# Patient Record
Sex: Female | Born: 1975 | Race: Black or African American | Hispanic: No | Smoking: Never smoker
Health system: Southern US, Community
[De-identification: ages and names within clinical notes are randomized; demographics above are authoritative.]

## PROBLEM LIST (undated history)

## (undated) DIAGNOSIS — R7303 Prediabetes: Secondary | ICD-10-CM

## (undated) DIAGNOSIS — M7730 Calcaneal spur, unspecified foot: Secondary | ICD-10-CM

## (undated) DIAGNOSIS — I10 Essential (primary) hypertension: Secondary | ICD-10-CM

## (undated) DIAGNOSIS — F32A Depression, unspecified: Secondary | ICD-10-CM

## (undated) HISTORY — PX: GANGLION CYST EXCISION: SHX1691

## (undated) HISTORY — DX: Depression, unspecified: F32.A

---

## 2006-11-16 ENCOUNTER — Emergency Department: Payer: Self-pay | Admitting: Unknown Physician Specialty

## 2007-01-02 ENCOUNTER — Emergency Department: Payer: Self-pay | Admitting: Emergency Medicine

## 2007-11-14 ENCOUNTER — Emergency Department: Payer: Self-pay | Admitting: Emergency Medicine

## 2007-11-15 ENCOUNTER — Emergency Department: Payer: Self-pay | Admitting: Emergency Medicine

## 2008-06-26 ENCOUNTER — Ambulatory Visit: Payer: Self-pay | Admitting: Gastroenterology

## 2009-11-16 ENCOUNTER — Emergency Department: Payer: Self-pay | Admitting: Emergency Medicine

## 2011-05-25 ENCOUNTER — Emergency Department: Payer: Self-pay | Admitting: Internal Medicine

## 2011-12-28 ENCOUNTER — Emergency Department: Payer: Self-pay | Admitting: Emergency Medicine

## 2012-01-25 ENCOUNTER — Ambulatory Visit: Payer: Self-pay | Admitting: Otolaryngology

## 2014-06-01 ENCOUNTER — Emergency Department: Payer: Self-pay | Admitting: Emergency Medicine

## 2014-12-02 ENCOUNTER — Emergency Department: Payer: Self-pay | Admitting: Emergency Medicine

## 2015-01-25 NOTE — Op Note (Signed)
PATIENT NAME:  Morgan Murphy, Morgan Murphy MR#:  [id number] DATE OF BIRTH:  1976-01-27  DATE OF PROCEDURE:  01/25/2012  PREOPERATIVE DIAGNOSIS: Tonsillar hyperplasia.   POSTOPERATIVE DIAGNOSIS: Tonsillar hyperplasia.   PROCEDURE PERFORMED: Tonsillectomy.   SURGEON: Malon Kindle, MD   ANESTHESIA: General endotracheal.   INDICATIONS: This is a 39 year old female with significant tonsillar hyperplasia with upper airway obstructive symptoms with loud snoring and possible sleep apnea.   FINDINGS: Tonsils were 3+ in size. The nasopharynx was inspected and there was no significant adenoid hyperplasia.   COMPLICATIONS: None.   DESCRIPTION OF PROCEDURE: After obtaining informed consent, the patient was taken to the operating room and placed in the supine position. After induction of general endotracheal anesthesia, the patient was turned 90 degrees and the head draped with the eyes protected. A McIvor retractor was used to open the mouth. The palate was palpated and there was no evidence of submucous cleft. The right tonsil was grasped with an Allis and resected from the tonsillar fossa in the usual fashion with the Bovie. The left tonsil was resected in a similar fashion. Cautery was used to control minor bleeding. Each tonsillar fossa was injected with 0.25% Marcaine with epinephrine 1:200,000. The nasopharynx was then inspected. There was no evidence of any significant adenoid hyperplasia. The throat was irrigated and suctioned to       remove any blood clot. She was returned to the anesthesiologist for awakening. She was awakened and taken to the recovery room in good condition postoperatively. Blood loss was less than 25 mL. ____________________________ Sammuel Hines. Richardson Landry, MD psb:slb D: 01/25/2012 07:59:59 ET T: 01/25/2012 09:30:00 ET JOB#: [id number]  cc: Sammuel Hines. Richardson Landry, MD, <Dictator> Riley Nearing MD ELECTRONICALLY SIGNED 02/07/2012 8:44

## 2015-07-14 ENCOUNTER — Other Ambulatory Visit: Payer: Self-pay | Admitting: Family Medicine

## 2015-07-14 DIAGNOSIS — R102 Pelvic and perineal pain: Principal | ICD-10-CM

## 2015-07-14 DIAGNOSIS — G8929 Other chronic pain: Secondary | ICD-10-CM

## 2015-07-15 ENCOUNTER — Ambulatory Visit
Admission: RE | Admit: 2015-07-15 | Discharge: 2015-07-15 | Disposition: A | Discharge status: Home / Self Care | Payer: BC Managed Care – PPO | Source: Ambulatory Visit | Attending: Family Medicine | Admitting: Family Medicine

## 2015-07-15 DIAGNOSIS — G8929 Other chronic pain: Secondary | ICD-10-CM | POA: Insufficient documentation

## 2015-07-15 DIAGNOSIS — R102 Pelvic and perineal pain: Secondary | ICD-10-CM | POA: Insufficient documentation

## 2015-07-15 DIAGNOSIS — D259 Leiomyoma of uterus, unspecified: Secondary | ICD-10-CM | POA: Diagnosis not present

## 2015-07-17 ENCOUNTER — Other Ambulatory Visit: Payer: Self-pay | Admitting: Family Medicine

## 2015-07-17 DIAGNOSIS — Z1231 Encounter for screening mammogram for malignant neoplasm of breast: Secondary | ICD-10-CM

## 2015-07-24 ENCOUNTER — Ambulatory Visit: Payer: BC Managed Care – PPO

## 2015-08-18 ENCOUNTER — Ambulatory Visit: Payer: BC Managed Care – PPO

## 2015-08-31 ENCOUNTER — Ambulatory Visit
Admission: RE | Admit: 2015-08-31 | Discharge: 2015-08-31 | Disposition: A | Discharge status: Home / Self Care | Payer: BC Managed Care – PPO | Source: Ambulatory Visit | Attending: Family Medicine | Admitting: Family Medicine

## 2015-08-31 DIAGNOSIS — Z1231 Encounter for screening mammogram for malignant neoplasm of breast: Secondary | ICD-10-CM | POA: Diagnosis not present

## 2016-08-10 ENCOUNTER — Encounter
Admission: RE | Admit: 2016-08-10 | Discharge: 2016-08-10 | Disposition: A | Discharge status: Home / Self Care | Payer: BC Managed Care – PPO | Source: Ambulatory Visit | Attending: Obstetrics & Gynecology | Admitting: Obstetrics & Gynecology

## 2016-08-10 DIAGNOSIS — D259 Leiomyoma of uterus, unspecified: Secondary | ICD-10-CM | POA: Insufficient documentation

## 2016-08-10 DIAGNOSIS — Z01812 Encounter for preprocedural laboratory examination: Secondary | ICD-10-CM | POA: Diagnosis present

## 2016-08-10 DIAGNOSIS — Z0183 Encounter for blood typing: Secondary | ICD-10-CM | POA: Insufficient documentation

## 2016-08-10 LAB — BASIC METABOLIC PANEL
Anion gap: 5 (ref 5–15)
BUN: 10 mg/dL (ref 6–20)
CALCIUM: 8.9 mg/dL (ref 8.9–10.3)
CHLORIDE: 108 mmol/L (ref 101–111)
CO2: 26 mmol/L (ref 22–32)
CREATININE: 0.74 mg/dL (ref 0.44–1.00)
Glucose, Bld: 96 mg/dL (ref 65–99)
Potassium: 3.8 mmol/L (ref 3.5–5.1)
SODIUM: 139 mmol/L (ref 135–145)

## 2016-08-10 LAB — CBC
HEMATOCRIT: 39 % (ref 35.0–47.0)
HEMOGLOBIN: 13.1 g/dL (ref 12.0–16.0)
MCH: 30.5 pg (ref 26.0–34.0)
MCHC: 33.5 g/dL (ref 32.0–36.0)
MCV: 91.1 fL (ref 80.0–100.0)
Platelets: 241 10*3/uL (ref 150–440)
RBC: 4.28 MIL/uL (ref 3.80–5.20)
RDW: 14.3 % (ref 11.5–14.5)
WBC: 5.7 10*3/uL (ref 3.6–11.0)

## 2016-08-10 LAB — TYPE AND SCREEN
ABO/RH(D): A POS
ANTIBODY SCREEN: NEGATIVE

## 2016-08-10 NOTE — Patient Instructions (Signed)
  Your procedure is scheduled [id number] 08/18/16 Report to Same Day Surgery 2nd floor medical mall To find out your arrival time please call [phone] between 1PM - 3PM on Wed 08/17/16  Remember: Instructions that are not followed completely may result in serious medical risk, up to and including death, or upon the discretion of your surgeon and anesthesiologist your surgery may need to be rescheduled.    _x___ 1. Do not eat food or drink liquids after midnight. No gum chewing or hard candies.     __x__ 2. No Alcohol for 24 hours before or after surgery.   __x__3. No Smoking for 24 prior to surgery.   ____  4. Bring all medications with you on the day of surgery if instructed.    __x__ 5. Notify your doctor if there is any change in your medical condition     (cold, fever, infections).     Do not wear jewelry, make-up, hairpins, clips or nail polish.  Do not wear lotions, powders, or perfumes. You may wear deodorant.  Do not shave 48 hours prior to surgery. Men may shave face and neck.  Do not bring valuables to the hospital.    [facility] is not responsible for any belongings or valuables.               Contacts, dentures or bridgework may not be worn into surgery.  Leave your suitcase in the car. After surgery it may be brought to your room.  For patients admitted to the hospital, discharge time is determined by your treatment team.   Patients discharged the day of surgery will not be allowed to drive home.    Please read over the following fact sheets that you were given:   [facility] Preparing for Surgery and or MRSA Information   _x___ Take these medicines the morning of surgery with A SIP OF WATER:    1. None  2.  3.  4.  5.  6.  ____Fleets enema or Magnesium Citrate as directed.   _x___ Use CHG Soap or sage wipes as directed on instruction sheet   ____ Use inhalers on the day of surgery and bring to hospital day of surgery  ____ Stop metformin 2 days prior  to surgery    ____ Take 1/2 of usual insulin dose the night before surgery and none on the morning of           surgery.   ____ Stop aspirin or coumadin, or plavix  x__ Stop Anti-inflammatories such as Advil, Aleve, Ibuprofen, Motrin, Naproxen,          Naprosyn, Goodies powders or aspirin products. Ok to take Tylenol.   ____ Stop supplements until after surgery.    ____ Bring C-Pap to the hospital.

## 2016-08-17 MED ORDER — CEFOXITIN SODIUM-DEXTROSE 2-2.2 GM-% IV SOLR (PREMIX)
2.0000 g | Freq: Once | INTRAVENOUS | Status: AC
  Administered 2016-08-18: 2000 mg via INTRAVENOUS

## 2016-08-18 ENCOUNTER — Inpatient Hospital Stay
Admission: AD | Admit: 2016-08-18 | Discharge: 2016-08-20 | DRG: 743 | Disposition: A | Discharge status: Home / Self Care | Payer: BC Managed Care – PPO | Source: Ambulatory Visit | Attending: Obstetrics & Gynecology | Admitting: Obstetrics & Gynecology

## 2016-08-18 ENCOUNTER — Ambulatory Visit: Payer: BC Managed Care – PPO | Admitting: Anesthesiology

## 2016-08-18 ENCOUNTER — Encounter
Admission: AD | Disposition: A | Discharge status: Home / Self Care | Payer: Self-pay | Source: Ambulatory Visit | Attending: Obstetrics & Gynecology

## 2016-08-18 ENCOUNTER — Encounter: Payer: Self-pay | Admitting: *Deleted

## 2016-08-18 DIAGNOSIS — D219 Benign neoplasm of connective and other soft tissue, unspecified: Secondary | ICD-10-CM | POA: Diagnosis present

## 2016-08-18 DIAGNOSIS — G8918 Other acute postprocedural pain: Secondary | ICD-10-CM | POA: Diagnosis present

## 2016-08-18 DIAGNOSIS — D259 Leiomyoma of uterus, unspecified: Principal | ICD-10-CM | POA: Diagnosis present

## 2016-08-18 DIAGNOSIS — N920 Excessive and frequent menstruation with regular cycle: Secondary | ICD-10-CM | POA: Diagnosis present

## 2016-08-18 HISTORY — PX: MYOMECTOMY: SHX85

## 2016-08-18 HISTORY — PX: LAPAROTOMY: SHX154

## 2016-08-18 LAB — ABO/RH: ABO/RH(D): A POS

## 2016-08-18 LAB — HEMOGLOBIN AND HEMATOCRIT, BLOOD
HEMATOCRIT: 37.3 % (ref 35.0–47.0)
HEMOGLOBIN: 12.1 g/dL (ref 12.0–16.0)

## 2016-08-18 LAB — POCT PREGNANCY, URINE: PREG TEST UR: NEGATIVE

## 2016-08-18 SURGERY — LAPAROTOMY
Anesthesia: General

## 2016-08-18 MED ORDER — DEXAMETHASONE SODIUM PHOSPHATE 10 MG/ML IJ SOLN
INTRAMUSCULAR | Status: DC | PRN
  Administered 2016-08-18: 8 mg via INTRAVENOUS

## 2016-08-18 MED ORDER — MIDAZOLAM HCL 2 MG/2ML IJ SOLN
INTRAMUSCULAR | Status: AC
  Administered 2016-08-18: 1 mg via INTRAVENOUS
  Filled 2016-08-18: qty 2

## 2016-08-18 MED ORDER — SUGAMMADEX SODIUM 200 MG/2ML IV SOLN
INTRAVENOUS | Status: DC | PRN
  Administered 2016-08-18: 50 mg via INTRAVENOUS
  Administered 2016-08-18: 200 mg via INTRAVENOUS

## 2016-08-18 MED ORDER — VASOPRESSIN 20 UNIT/ML IV SOLN
INTRAVENOUS | Status: DC | PRN
  Administered 2016-08-18: 32 mL via INTRAMUSCULAR

## 2016-08-18 MED ORDER — SIMETHICONE 80 MG PO CHEW
80.0000 mg | CHEWABLE_TABLET | Freq: Four times a day (QID) | ORAL | Status: DC | PRN
  Administered 2016-08-19 (×2): 80 mg via ORAL
  Filled 2016-08-18 (×2): qty 1

## 2016-08-18 MED ORDER — OXYCODONE-ACETAMINOPHEN 5-325 MG PO TABS
1.0000 | ORAL_TABLET | ORAL | Status: DC | PRN
  Administered 2016-08-18 – 2016-08-20 (×8): 2 via ORAL
  Administered 2016-08-20: 1 via ORAL
  Filled 2016-08-18 (×9): qty 2

## 2016-08-18 MED ORDER — ACETAMINOPHEN 325 MG PO TABS: 650.0000 mg | ORAL_TABLET | ORAL | Status: DC | PRN

## 2016-08-18 MED ORDER — DEXTROSE 5 % IV SOLN
2.0000 g | Freq: Once | INTRAVENOUS | Status: AC
  Administered 2016-08-18: 2 g via INTRAVENOUS
  Filled 2016-08-18: qty 2

## 2016-08-18 MED ORDER — MORPHINE SULFATE (PF) 2 MG/ML IV SOLN
1.0000 mg | INTRAVENOUS | Status: DC | PRN
  Administered 2016-08-18 – 2016-08-19 (×7): 2 mg via INTRAVENOUS
  Administered 2016-08-20: 1 mg via INTRAVENOUS
  Administered 2016-08-20: 2 mg via INTRAVENOUS
  Filled 2016-08-18 (×9): qty 1

## 2016-08-18 MED ORDER — BISACODYL 10 MG RE SUPP
10.0000 mg | Freq: Every day | RECTAL | Status: DC | PRN
  Administered 2016-08-20: 10 mg via RECTAL
  Filled 2016-08-18: qty 1

## 2016-08-18 MED ORDER — ONDANSETRON HCL 4 MG/2ML IJ SOLN
INTRAMUSCULAR | Status: DC | PRN
  Administered 2016-08-18: 4 mg via INTRAVENOUS

## 2016-08-18 MED ORDER — FENTANYL CITRATE (PF) 100 MCG/2ML IJ SOLN
25.0000 ug | INTRAMUSCULAR | Status: DC | PRN
  Administered 2016-08-18 (×5): 25 ug via INTRAVENOUS

## 2016-08-18 MED ORDER — FENTANYL CITRATE (PF) 100 MCG/2ML IJ SOLN
INTRAMUSCULAR | Status: DC | PRN
  Administered 2016-08-18 (×2): 50 ug via INTRAVENOUS
  Administered 2016-08-18: 75 ug via INTRAVENOUS
  Administered 2016-08-18: 25 ug via INTRAVENOUS
  Administered 2016-08-18 (×2): 50 ug via INTRAVENOUS

## 2016-08-18 MED ORDER — BUPIVACAINE HCL (PF) 0.5 % IJ SOLN
INTRAMUSCULAR | Status: AC
  Filled 2016-08-18: qty 30

## 2016-08-18 MED ORDER — ACETAMINOPHEN 10 MG/ML IV SOLN
INTRAVENOUS | Status: AC
  Filled 2016-08-18: qty 100

## 2016-08-18 MED ORDER — FAMOTIDINE 20 MG PO TABS
20.0000 mg | ORAL_TABLET | Freq: Once | ORAL | Status: AC
  Administered 2016-08-18: 20 mg via ORAL

## 2016-08-18 MED ORDER — CEFOXITIN SODIUM-DEXTROSE 2-2.2 GM-% IV SOLR (PREMIX)
INTRAVENOUS | Status: AC
  Administered 2016-08-18: 2000 mg via INTRAVENOUS
  Filled 2016-08-18: qty 50

## 2016-08-18 MED ORDER — FENTANYL CITRATE (PF) 100 MCG/2ML IJ SOLN
INTRAMUSCULAR | Status: AC
  Filled 2016-08-18: qty 2

## 2016-08-18 MED ORDER — FAMOTIDINE 20 MG PO TABS
ORAL_TABLET | ORAL | Status: AC
  Administered 2016-08-18: 20 mg via ORAL
  Filled 2016-08-18: qty 1

## 2016-08-18 MED ORDER — PROPOFOL 10 MG/ML IV BOLUS
INTRAVENOUS | Status: DC | PRN
  Administered 2016-08-18: 170 mg via INTRAVENOUS

## 2016-08-18 MED ORDER — LIDOCAINE HCL (CARDIAC) 20 MG/ML IV SOLN
INTRAVENOUS | Status: DC | PRN
  Administered 2016-08-18: 100 mg via INTRAVENOUS

## 2016-08-18 MED ORDER — ROCURONIUM BROMIDE 100 MG/10ML IV SOLN
INTRAVENOUS | Status: DC | PRN
  Administered 2016-08-18 (×2): 10 mg via INTRAVENOUS
  Administered 2016-08-18: 40 mg via INTRAVENOUS
  Administered 2016-08-18 (×2): 10 mg via INTRAVENOUS
  Administered 2016-08-18 (×2): 20 mg via INTRAVENOUS

## 2016-08-18 MED ORDER — DOCUSATE SODIUM 100 MG PO CAPS
100.0000 mg | ORAL_CAPSULE | Freq: Two times a day (BID) | ORAL | Status: DC
  Administered 2016-08-18 – 2016-08-20 (×4): 100 mg via ORAL
  Filled 2016-08-18 (×4): qty 1

## 2016-08-18 MED ORDER — LACTATED RINGERS IV SOLN
INTRAVENOUS | Status: DC
  Administered 2016-08-18 – 2016-08-19 (×2): via INTRAVENOUS

## 2016-08-18 MED ORDER — BUPIVACAINE LIPOSOME 1.3 % IJ SUSP
INTRAMUSCULAR | Status: AC
  Filled 2016-08-18: qty 20

## 2016-08-18 MED ORDER — MIDAZOLAM HCL 2 MG/2ML IJ SOLN
INTRAMUSCULAR | Status: DC | PRN
  Administered 2016-08-18 (×2): 1 mg via INTRAVENOUS

## 2016-08-18 MED ORDER — LACTATED RINGERS IV SOLN
INTRAVENOUS | Status: DC
  Administered 2016-08-18 (×4): via INTRAVENOUS

## 2016-08-18 MED ORDER — BUPIVACAINE LIPOSOME 1.3 % IJ SUSP
INTRAMUSCULAR | Status: DC | PRN
  Administered 2016-08-18: 50 mL

## 2016-08-18 MED ORDER — ACETAMINOPHEN 10 MG/ML IV SOLN
INTRAVENOUS | Status: DC | PRN
  Administered 2016-08-18: 1000 mg via INTRAVENOUS

## 2016-08-18 MED ORDER — MIDAZOLAM HCL 2 MG/2ML IJ SOLN
1.0000 mg | Freq: Once | INTRAMUSCULAR | Status: AC
  Administered 2016-08-18: 1 mg via INTRAVENOUS

## 2016-08-18 MED ORDER — ONDANSETRON HCL 4 MG/2ML IJ SOLN: 4.0000 mg | Freq: Four times a day (QID) | INTRAMUSCULAR | Status: DC | PRN

## 2016-08-18 MED ORDER — VASOPRESSIN 20 UNIT/ML IV SOLN
INTRAVENOUS | Status: AC
  Filled 2016-08-18: qty 2

## 2016-08-18 MED ORDER — SENNOSIDES-DOCUSATE SODIUM 8.6-50 MG PO TABS: 1.0000 | ORAL_TABLET | Freq: Every evening | ORAL | Status: DC | PRN

## 2016-08-18 MED ORDER — SODIUM CHLORIDE 0.9 % IJ SOLN
INTRAMUSCULAR | Status: AC
Start: 2016-08-18 — End: 2016-08-18
  Filled 2016-08-18: qty 150

## 2016-08-18 MED ORDER — ONDANSETRON HCL 4 MG/2ML IJ SOLN: 4.0000 mg | Freq: Once | INTRAMUSCULAR | Status: DC | PRN

## 2016-08-18 MED ORDER — ONDANSETRON HCL 4 MG PO TABS: 4.0000 mg | ORAL_TABLET | Freq: Four times a day (QID) | ORAL | Status: DC | PRN

## 2016-08-18 SURGICAL SUPPLY — 51 items
ADHESIVE MASTISOL STRL (MISCELLANEOUS) ×3 IMPLANT
BAG COUNTER SPONGE EZ (MISCELLANEOUS) ×2 IMPLANT
BARRIER ADHS 3X4 INTERCEED (GAUZE/BANDAGES/DRESSINGS) ×3 IMPLANT
CANISTER SUCT 1200ML W/VALVE (MISCELLANEOUS) ×3 IMPLANT
CATH FOL LEG HOLDER (MISCELLANEOUS) ×3 IMPLANT
CATH TRAY 16F METER LATEX (MISCELLANEOUS) ×3 IMPLANT
CHLORAPREP W/TINT 26ML (MISCELLANEOUS) ×3 IMPLANT
CLOSURE WOUND 1/2 X4 (GAUZE/BANDAGES/DRESSINGS) ×3
COUNTER NEEDLE 20/40 LG (NEEDLE) ×6 IMPLANT
COUNTER SPONGE BAG EZ (MISCELLANEOUS) ×1
DRAPE LAPAROTOMY 100X77 ABD (DRAPES) ×3 IMPLANT
DRAPE LAPAROTOMY TRNSV 106X77 (MISCELLANEOUS) ×3 IMPLANT
DRESSING SURGICEL FIBRLLR 1X2 (HEMOSTASIS) ×1 IMPLANT
DRSG SURGICEL FIBRILLAR 1X2 (HEMOSTASIS) ×3
DRSG TELFA 3X8 NADH (GAUZE/BANDAGES/DRESSINGS) ×3 IMPLANT
ELECT BLADE 6 FLAT ULTRCLN (ELECTRODE) ×3 IMPLANT
ELECT BLADE 6.5 EXT (BLADE) ×3 IMPLANT
ELECT CAUTERY BLADE 6.4 (BLADE) ×3 IMPLANT
ELECT REM PT RETURN 9FT ADLT (ELECTROSURGICAL) ×3
ELECTRODE REM PT RTRN 9FT ADLT (ELECTROSURGICAL) ×1 IMPLANT
GAUZE SPONGE 4X4 12PLY STRL (GAUZE/BANDAGES/DRESSINGS) ×3 IMPLANT
GLOVE BIO SURGEON STRL SZ8 (GLOVE) ×3 IMPLANT
GOWN STRL REUS W/ TWL LRG LVL3 (GOWN DISPOSABLE) ×1 IMPLANT
GOWN STRL REUS W/ TWL XL LVL3 (GOWN DISPOSABLE) ×1 IMPLANT
GOWN STRL REUS W/TWL LRG LVL3 (GOWN DISPOSABLE) ×2
GOWN STRL REUS W/TWL XL LVL3 (GOWN DISPOSABLE) ×2
KIT RM TURNOVER STRD PROC AR (KITS) ×3 IMPLANT
LABEL OR SOLS (LABEL) ×3 IMPLANT
NS IRRIG 1000ML POUR BTL (IV SOLUTION) ×3 IMPLANT
PACK BASIN MAJOR ARMC (MISCELLANEOUS) ×3 IMPLANT
PAD OB MATERNITY 4.3X12.25 (PERSONAL CARE ITEMS) ×3 IMPLANT
SPONGE LAP 18X18 5 PK (GAUZE/BANDAGES/DRESSINGS) ×6 IMPLANT
STAPLER SKIN PROX 35W (STAPLE) ×3 IMPLANT
STRAP SAFETY BODY (MISCELLANEOUS) ×3 IMPLANT
STRIP CLOSURE SKIN 1/2X4 (GAUZE/BANDAGES/DRESSINGS) ×6 IMPLANT
SUT ETHIBOND 0 (SUTURE) ×3 IMPLANT
SUT ETHIBOND CT1 BRD #0 30IN (SUTURE) ×3 IMPLANT
SUT MAXON ABS #0 GS21 30IN (SUTURE) ×6 IMPLANT
SUT PDS AB 1 TP1 96 (SUTURE) ×3 IMPLANT
SUT VIC AB 0 CT1 27 (SUTURE) ×6
SUT VIC AB 0 CT1 27XCR 8 STRN (SUTURE) ×3 IMPLANT
SUT VIC AB 0 CT1 36 (SUTURE) ×12 IMPLANT
SUT VIC AB 1 CT1 36 (SUTURE) ×3 IMPLANT
SUT VIC AB 2-0 CT1 27 (SUTURE) ×4
SUT VIC AB 2-0 CT1 TAPERPNT 27 (SUTURE) ×2 IMPLANT
SUT VIC AB 4-0 PS2 18 (SUTURE) ×3 IMPLANT
SUT VICRYL PLUS ABS 0 54 (SUTURE) ×6 IMPLANT
SYR 30ML LL (SYRINGE) ×3 IMPLANT
SYR CONTROL 10ML (SYRINGE) ×3 IMPLANT
SYRINGE 10CC LL (SYRINGE) ×3 IMPLANT
TRAY PREP VAG/GEN (MISCELLANEOUS) ×3 IMPLANT

## 2016-08-18 NOTE — Transfer of Care (Signed)
Immediate Anesthesia Transfer of Care Note  Patient: Morgan Murphy  Procedure(s) Performed: Procedure(s): LAPAROTOMY (N/A) MYOMECTOMY (N/A)  Patient Location: PACU  Anesthesia Type:General  Level of Consciousness: sedated  Airway & Oxygen Therapy: Patient Spontanous Breathing and Patient connected to face mask oxygen  Post-op Assessment: Report given to RN and Post -op Vital signs reviewed and stable  Post vital signs: Reviewed and stable  Last Vitals:  Vitals:   08/18/16 0646 08/18/16 1119  BP: 128/84 (P) 131/79  Pulse: 90   Resp: 20   Temp: 36.9 C (P) 37.6 C    Last Pain:  Vitals:   08/18/16 0646  TempSrc: Tympanic      Patients Stated Pain Goal: 2 ([id number])  Complications: No apparent anesthesia complications

## 2016-08-18 NOTE — Anesthesia Postprocedure Evaluation (Signed)
Anesthesia Post Note  Patient: Morgan Murphy  Procedure(s) Performed: Procedure(s) (LRB): LAPAROTOMY (N/A) MYOMECTOMY (N/A)  Patient location during evaluation: PACU Anesthesia Type: General Level of consciousness: awake and alert and oriented Pain management: pain level controlled Vital Signs Assessment: post-procedure vital signs reviewed and stable Respiratory status: spontaneous breathing Cardiovascular status: blood pressure returned to baseline Anesthetic complications: no    Last Vitals:  Vitals:   08/18/16 1343 08/18/16 1447  BP: 119/68 120/69  Pulse: 77 90  Resp: 18 20  Temp: 37.1 C 36.9 C    Last Pain:  Vitals:   08/18/16 1506  TempSrc:   PainSc: 7                  Maryfer Tauzin

## 2016-08-18 NOTE — Anesthesia Preprocedure Evaluation (Addendum)
Anesthesia Evaluation  Patient identified by MRN, date of birth, ID band Patient awake    Reviewed: Allergy & Precautions, NPO status , Patient's Chart, lab work & pertinent test results  Airway Mallampati: II  TM Distance: >3 FB     Dental  (+) Chipped   Pulmonary neg pulmonary ROS,    Pulmonary exam normal        Cardiovascular negative cardio ROS Normal cardiovascular exam     Neuro/Psych negative neurological ROS  negative psych ROS   GI/Hepatic negative GI ROS, Neg liver ROS,   Endo/Other  negative endocrine ROS  Renal/GU negative Renal ROS  Female GU complaint     Musculoskeletal negative musculoskeletal ROS (+)   Abdominal Normal abdominal exam  (+)   Peds negative pediatric ROS (+)  Hematology negative hematology ROS (+)   Anesthesia Other Findings   Reproductive/Obstetrics                            Anesthesia Physical Anesthesia Plan  ASA: II  Anesthesia Plan: General   Post-op Pain Management:    Induction: Intravenous  Airway Management Planned: Oral ETT  Additional Equipment:   Intra-op Plan:   Post-operative Plan: Extubation in OR  Informed Consent: I have reviewed the patients History and Physical, chart, labs and discussed the procedure including the risks, benefits and alternatives for the proposed anesthesia with the patient or authorized representative who has indicated his/her understanding and acceptance.   Dental advisory given  Plan Discussed with: CRNA and Surgeon  Anesthesia Plan Comments:         Anesthesia Quick Evaluation

## 2016-08-18 NOTE — Op Note (Signed)
Operative Note   08/18/2016 11:15 AM  PRE-OP DIAGNOSIS: FIBROID UTERUS   POST-OP DIAGNOSIS: same   PROCEDURE: Laparotomy, Open Myomectomy   SURGEON: Barnett Applebaum, MD, FACOG  ASSISTANT: Dr Glennon Mac  ANESTHESIA: GEN  ESTIMATED BLOOD LOSS: 0000000 mL   COMPLICATIONS: None  VTE PROPHYLAXIS: SCDs to the bilateral lower extremities  ANTIBIOTICS: Cefoxitin 2 g  CONDITION: stable  INDICATIONS: Multiglobular fibroid uterus with menorrhagia  DISPOSITION: PACU - hemodynamically stable.  FINDINGS: Examination under anesthesia revealed an approximately 18 week size, mobile globular uterus. Laparotomy findings included an enlarged uterus with multiple (14) uterine fibroids with largest approximately 6cm.   PROCEDURE IN DETAIL:  The patient was taken to the OR where her general anesthesia was administered. She was prepped and draped in the usual sterile fashion in the supine position . A Foley catheter was sterilely placed. Attention was turned to the patient's abdomen, where a midline vertical incision was made with the scalpel and carried to the underlying layer of fascia. The fascia was incised in the midline and this incision was extended with Mayo scissors. Next, the peritoneum was identified and grasped with Kelly clamps and entered sharply with Metzenbaum scissors. This incision was extended superiorly and inferiorly with the bovie with good visualization of the bladder. A survey of the patient's abdomen was performed with the above noted findings.   Next, the uterus was lifted out of the pelvis and the bowel packed with moist laparotomy sponges. The uterine serosa was injected with approximately 32 mL (in total) of dilute vasopressin in a verticle line with blanching noted over each myoma to be removed (some removed through same initial incision). A vertical incision was made through the superficial myometrium to the level of each myoma with a scalpel. The fibroid was grasped with a towel  clips and the myoma was shelled out using the bovie/metz scissors in the plane of cleavage between the myoma and myometrium. The myometrium was then reapproximated in approximately two to four layers using 0 vicryl suture. The serosa was closed using 2-0 vicryl in a baseball stitch. Excellent hemostasis was noted.  This was repeated for all the fibroids removed (14). The uterus was returned to the abdomen, the laparotomy sponges were removed and the pelvis was irrigated. Again the incision was inspected and excellent hemostasis was noted.  Interceed was placed over all incisions (ant and post) to minimize future adhesion formation.  No encroachment on fallopian tubes was encountered.  The endometrial cavity was penetrated at the fundus region, and closed with a 2-0 Vicryl suture.  The peritoneum was closed with 2-0 vicryl suture. The fascia was reapproximated using a 0 PDS suture. Subcutaneous layer irrigated and hemostasis assured.  Subcutaneous layer closed with 0 Plain Gut suture.  The skin was closed with 4-0 vicryl in subcuticular fashion followed by steristrips and a compressive dressing.  The patient tolerated the procedure well. All sponge, lap and needle counts were correct x 2. The patient was taken to the recovery room in stable condition.

## 2016-08-18 NOTE — H&P (Signed)
History and Physical Interval Note:  08/18/2016 7:03 AM  Morgan Murphy  has presented today for surgery, with the diagnosis of FIBROID UTERUS  The various methods of treatment have been discussed with the patient and family. After consideration of risks, benefits and other options for treatment, the patient has consented to  Procedure(s): LAPAROTOMY (N/A) MYOMECTOMY (N/A) as a surgical intervention .  The patient's history has been reviewed, patient examined, no change in status, stable for surgery.  Pt has the following beta blocker history-  Not taking Beta Blocker.  I have reviewed the patient's chart and labs.  Questions were answered to the patient's satisfaction.       Hoyt Koch

## 2016-08-18 NOTE — Progress Notes (Signed)
Day of Surgery Procedure(s) (LRB): LAPAROTOMY (N/A) MYOMECTOMY (N/A)  Subjective: Patient reports incisional pain and tolerating PO.    Objective: I have reviewed patient's vital signs, intake and output and medications.  Abd: Min T, ND Incision: clean, dry and intact Extr: no calf T, no edema  Assessment: s/p Procedure(s): LAPAROTOMY (N/A) MYOMECTOMY (N/A): stable  Plan: Advance diet Encourage ambulation Advance to PO medication  LOS: 0 days    Hoyt Koch 08/18/2016, 7:57 PM

## 2016-08-18 NOTE — Anesthesia Procedure Notes (Signed)

## 2016-08-19 DIAGNOSIS — N920 Excessive and frequent menstruation with regular cycle: Secondary | ICD-10-CM | POA: Diagnosis present

## 2016-08-19 DIAGNOSIS — D259 Leiomyoma of uterus, unspecified: Secondary | ICD-10-CM | POA: Diagnosis present

## 2016-08-19 DIAGNOSIS — G8918 Other acute postprocedural pain: Secondary | ICD-10-CM | POA: Diagnosis present

## 2016-08-19 LAB — SURGICAL PATHOLOGY

## 2016-08-19 LAB — HEMOGLOBIN: HEMOGLOBIN: 10.3 g/dL — AB (ref 12.0–16.0)

## 2016-08-19 NOTE — Progress Notes (Signed)
1 Day Post-Op Procedure(s) (LRB): LAPAROTOMY (N/A) MYOMECTOMY (N/A)  Subjective: Patient reports fatigue, incisional pain and tolerating PO.    Objective: I have reviewed patient's vital signs, intake and output and medications.  Abd: Min T, ND Incision: clean, dry and intact Extr: no calf T, no edema  Assessment: s/p Procedure(s): LAPAROTOMY (N/A) MYOMECTOMY (N/A): stable, more pain today  Plan: Advance diet Encourage ambulation Advance to PO medication  Shower and change dressing Monitor BS and flatus   LOS: 0 days    Morgan Murphy 08/19/2016, 10:30 AM

## 2016-08-20 MED ORDER — SENNOSIDES-DOCUSATE SODIUM 8.6-50 MG PO TABS: 1.0000 | ORAL_TABLET | Freq: Every evening | ORAL | 0 refills | Status: DC | PRN

## 2016-08-20 MED ORDER — OXYCODONE-ACETAMINOPHEN 5-325 MG PO TABS: 1.0000 | ORAL_TABLET | ORAL | 0 refills | Status: DC | PRN

## 2016-08-20 NOTE — Progress Notes (Signed)
Discharge instructions complete and prescriptions given. Patient verbalizes understanding of teaching. Patient discharged home at 1145.

## 2016-08-20 NOTE — Discharge Summary (Signed)
Gynecology Physician Postoperative Discharge Summary  Patient ID: Morgan Murphy MRN: [id number] DOB/AGE: May 05, 1976 40 y.o.  Admit Date: 08/18/2016 Discharge Date: 08/20/2016  Preoperative Diagnoses: Fibroid Uterus and Menorrhagia  Procedures: Procedure(s) (LRB): LAPAROTOMY (N/A) MYOMECTOMY (N/A)  Significant Labs: CBC Latest Ref Rng & Units 08/19/2016 08/18/2016 08/10/2016  WBC 3.6 - 11.0 K/uL - - 5.7  Hemoglobin 12.0 - 16.0 g/dL 10.3(L) 12.1 13.1  Hematocrit 35.0 - 47.0 % - 37.3 39.0  Platelets 150 - 440 K/uL - - 241    Hospital Course:  Morgan Murphy is a 40 y.o. No obstetric history on file.  admitted for scheduled surgery.  She underwent the procedures as mentioned above, her operation was uncomplicated. For further details about surgery, please refer to the operative report. Patient had an uncomplicated postoperative course. By time of discharge on POD#2, her pain was controlled on oral pain medications; she was ambulating, voiding without difficulty, tolerating regular diet and passing flatus. She was deemed stable for discharge to home.   Discharge Exam: Blood pressure 128/64, pulse 84, temperature 98.6 F (37 C), temperature source Oral, resp. rate 16, height 5\' 7"  (1.702 m), weight 254 lb (115.2 kg), last menstrual period 08/05/2016, SpO2 100 %. General appearance: alert and no distress  Resp: clear to auscultation bilaterally  Cardio: regular rate and rhythm  GI: soft, non-tender; bowel sounds normal; no masses, no organomegaly.  Incision: C/D/I, no erythema, no drainage noted Pelvic: scant blood on pad  Extremities: extremities normal, atraumatic, no cyanosis or edema and Homans sign is negative, no sign of DVT  Discharged Condition: Stable  Disposition: Final discharge disposition not confirmed     Medication List    TAKE these medications   ibuprofen 200 MG tablet Commonly known as:  ADVIL,MOTRIN Take 400 mg by mouth every 8 (eight) hours as needed (for  pain.).   oxyCODONE-acetaminophen 5-325 MG tablet Commonly known as:  PERCOCET/ROXICET Take 1-2 tablets by mouth every 4 (four) hours as needed (moderate to severe pain (when tolerating fluids)).   senna-docusate 8.6-50 MG tablet Commonly known as:  Senokot-S Take 1 tablet by mouth at bedtime as needed for mild constipation.        Barnett Applebaum, MD

## 2016-08-23 ENCOUNTER — Encounter: Payer: Self-pay | Admitting: Obstetrics & Gynecology

## 2018-03-07 ENCOUNTER — Other Ambulatory Visit: Payer: Self-pay | Admitting: Family Medicine

## 2018-03-07 DIAGNOSIS — Z1231 Encounter for screening mammogram for malignant neoplasm of breast: Secondary | ICD-10-CM

## 2018-03-19 ENCOUNTER — Ambulatory Visit
Admission: RE | Admit: 2018-03-19 | Discharge: 2018-03-19 | Disposition: A | Discharge status: Home / Self Care | Payer: BC Managed Care – PPO | Source: Ambulatory Visit | Attending: Family Medicine | Admitting: Family Medicine

## 2018-03-19 DIAGNOSIS — Z1231 Encounter for screening mammogram for malignant neoplasm of breast: Secondary | ICD-10-CM | POA: Diagnosis present

## 2018-12-24 ENCOUNTER — Encounter: Payer: Self-pay | Admitting: Emergency Medicine

## 2018-12-24 ENCOUNTER — Emergency Department: Payer: BC Managed Care – PPO

## 2018-12-24 ENCOUNTER — Emergency Department
Admission: EM | Admit: 2018-12-24 | Discharge: 2018-12-24 | Disposition: A | Discharge status: Home / Self Care | Payer: BC Managed Care – PPO | Attending: Emergency Medicine | Admitting: Emergency Medicine

## 2018-12-24 ENCOUNTER — Other Ambulatory Visit: Payer: Self-pay

## 2018-12-24 DIAGNOSIS — R079 Chest pain, unspecified: Secondary | ICD-10-CM | POA: Diagnosis not present

## 2018-12-24 DIAGNOSIS — R002 Palpitations: Secondary | ICD-10-CM | POA: Diagnosis present

## 2018-12-24 LAB — CBC
HEMATOCRIT: 41.8 % (ref 36.0–46.0)
Hemoglobin: 13.4 g/dL (ref 12.0–15.0)
MCH: 30 pg (ref 26.0–34.0)
MCHC: 32.1 g/dL (ref 30.0–36.0)
MCV: 93.7 fL (ref 80.0–100.0)
NRBC: 0 % (ref 0.0–0.2)
Platelets: 276 10*3/uL (ref 150–400)
RBC: 4.46 MIL/uL (ref 3.87–5.11)
RDW: 13.5 % (ref 11.5–15.5)
WBC: 6.9 10*3/uL (ref 4.0–10.5)

## 2018-12-24 LAB — COMPREHENSIVE METABOLIC PANEL
ALBUMIN: 4.1 g/dL (ref 3.5–5.0)
ALK PHOS: 60 U/L (ref 38–126)
ALT: 24 U/L (ref 0–44)
AST: 22 U/L (ref 15–41)
Anion gap: 7 (ref 5–15)
BILIRUBIN TOTAL: 0.6 mg/dL (ref 0.3–1.2)
BUN: 12 mg/dL (ref 6–20)
CALCIUM: 8.7 mg/dL — AB (ref 8.9–10.3)
CO2: 23 mmol/L (ref 22–32)
Chloride: 110 mmol/L (ref 98–111)
Creatinine, Ser: 0.72 mg/dL (ref 0.44–1.00)
GFR calc Af Amer: >60 mL/min (ref >60)
GLUCOSE: 124 mg/dL — AB (ref 70–99)
Potassium: 3.4 mmol/L — ABNORMAL LOW (ref 3.5–5.1)
Sodium: 140 mmol/L (ref 135–145)
TOTAL PROTEIN: 7.5 g/dL (ref 6.5–8.1)

## 2018-12-24 LAB — POCT PREGNANCY, URINE: Preg Test, Ur: NEGATIVE

## 2018-12-24 LAB — TROPONIN I: Troponin I: <0.03 ng/mL (ref <0.03)

## 2018-12-24 LAB — TSH: TSH: 1.689 u[IU]/mL (ref 0.350–4.500)

## 2018-12-24 MED ORDER — ASPIRIN 81 MG PO CHEW
324.0000 mg | CHEWABLE_TABLET | Freq: Once | ORAL | Status: AC
  Administered 2018-12-24: 324 mg via ORAL
  Filled 2018-12-24: qty 4

## 2018-12-24 MED ORDER — SODIUM CHLORIDE 0.9% FLUSH: 3.0000 mL | Freq: Once | INTRAVENOUS | Status: DC

## 2018-12-24 MED ORDER — POTASSIUM CHLORIDE CRYS ER 20 MEQ PO TBCR
40.0000 meq | EXTENDED_RELEASE_TABLET | Freq: Once | ORAL | Status: AC
  Administered 2018-12-24: 40 meq via ORAL
  Filled 2018-12-24: qty 2

## 2018-12-24 NOTE — ED Provider Notes (Addendum)
[facility] Emergency Department Provider Note  ____________________________________________  Time seen: Approximately 11:37 AM  I have reviewed the triage vital signs and the nursing notes.   HISTORY  Chief Complaint Palpitations    HPI Morgan Murphy is a 43 y.o. female with obesity but otherwise healthy presenting with palpitations and chest pain.  The patient reports that she woke up at 5:45 AM and had a heart racing sensation.  This persisted and around 6:45 AM she developed a left-sided chest pressure with "tingling" in all of her extremities.  She did not have any lightheadedness, diaphoresis, nausea or vomiting, syncope.  She has not been experiencing chest pain over the last several days.  She has not had any cough or cold symptoms, fevers or chills.  She has never undergone re-stratification study including stress test or cardiac cath.  She was able to eat and drink without difficulty.  SH: no tobacco, cocaine    History reviewed. No pertinent past medical history.  Patient Active Problem List   Diagnosis Date Noted  . Post-op pain 08/19/2016  . Fibroids 08/18/2016  . Menorrhagia 08/18/2016    Past Surgical History:  Procedure Laterality Date  . GANGLION CYST EXCISION    . LAPAROTOMY N/A 08/18/2016   Procedure: LAPAROTOMY;  Surgeon: Gae Dry, MD;  Location: [facility] ORS;  Service: Gynecology;  Laterality: N/A;  . MYOMECTOMY N/A 08/18/2016   Procedure: MYOMECTOMY;  Surgeon: Gae Dry, MD;  Location: [facility] ORS;  Service: Gynecology;  Laterality: N/A;    Current Outpatient Rx  . Order #: [id number] Class: Historical Med  . Order #: [id number] Class: Historical Med  . Order #: [id number] Class: Historical Med  . Order #: [id number] Class: Historical Med  . Order #: [id number] Class: Historical Med  . Order #: [id number] Class: Print  . Order #: [id number] Class: Normal    Allergies Patient has no known allergies.  No family history on  file.  Social History Social History   Tobacco Use  . Smoking status: Never Smoker  . Smokeless tobacco: Never Used  Substance Use Topics  . Alcohol use: Yes    Alcohol/week: 2.0 standard drinks    Types: 2 Shots of liquor per week  . Drug use: Yes    Types: Marijuana    Review of Systems Constitutional: No fever/chills.  No lightheadedness or syncope. Eyes: No visual changes. ENT: No sore throat. No congestion or rhinorrhea. Cardiovascular: + chest pain. + palpitations. Respiratory: Denies shortness of breath.  No cough. Gastrointestinal: No abdominal pain.  No nausea, no vomiting.  No diarrhea.  No constipation. Genitourinary: Negative for dysuria. Musculoskeletal: Negative for back pain.  No lower extremity swelling or calf pain. Skin: Negative for rash. Neurological: Negative for headaches. No focal numbness, tingling or weakness.     ____________________________________________   PHYSICAL EXAM:  VITAL SIGNS: ED Triage Vitals  Enc Vitals Group     BP 12/24/18 1021 137/85     Pulse Rate 12/24/18 1021 (!) 112     Resp 12/24/18 1021 20     Temp 12/24/18 1021 98.6 F (37 C)     Temp Source 12/24/18 1021 Oral     SpO2 12/24/18 1021 100 %     Weight 12/24/18 1021 261 lb (118.4 kg)     Height 12/24/18 1021 5\' 6"  (1.676 m)     Head Circumference --      Peak Flow --      Pain Score 12/24/18 1026 0     Pain Loc --  Pain Edu? --      Excl. in [place]? --     Constitutional: Alert and oriented.  Answers questions appropriately. Eyes: Conjunctivae are normal.  EOMI. No scleral icterus. Head: Atraumatic. Nose: No congestion/rhinnorhea. Mouth/Throat: Mucous membranes are moist.  Neck: No stridor.  Supple.  No JVD. Cardiovascular: When I enter the room, the patient's heart rate is 98; this would begin talking, her heart rate goes as high as 109.  Normal rate, regular rhythm. No murmurs, rubs or gallops.  Respiratory: Normal respiratory effort.  No accessory muscle  use or retractions. Lungs CTAB.  No wheezes, rales or ronchi. Gastrointestinal: Soft, nontender and nondistended.  No guarding or rebound.  No peritoneal signs. Musculoskeletal: No LE edema. No ttp in the calves or palpable cords.  Negative Homan's sign. Neurologic:  A&Ox3.  Speech is clear.  Face and smile are symmetric.  EOMI.  Moves all extremities well. Skin:  Skin is warm, dry and intact. No rash noted. Psychiatric: Mood and affect are normal. Speech and behavior are normal.  Normal judgement.  ____________________________________________   LABS (all labs ordered are listed, but only abnormal results are displayed)  Labs Reviewed  COMPREHENSIVE METABOLIC PANEL - Abnormal; Notable for the following components:      Result Value   Potassium 3.4 (*)    Glucose, Bld 124 (*)    Calcium 8.7 (*)    All other components within normal limits  CBC  TROPONIN I  TSH  POC URINE PREG, ED  POCT PREGNANCY, URINE   ____________________________________________  EKG  ED ECG REPORT I, Anne-Caroline Mariea Clonts, the attending physician, personally viewed and interpreted this ECG.   Date: 12/24/2018  EKG Time: 1011  Rate: 110  Rhythm: sinus tachycardia  Axis: normal  Intervals:none  ST&T Change: No STEMI; no evidence of Brugada, hypertrophy or prolonged QTC.  ____________________________________________  RADIOLOGY  Dg Chest 2 View  Result Date: 12/24/2018 CLINICAL DATA:  Chest pressure EXAM: CHEST - 2 VIEW COMPARISON:  None. FINDINGS: The heart size and mediastinal contours are within normal limits. Both lungs are clear. The visualized skeletal structures are unremarkable. IMPRESSION: No active cardiopulmonary disease. Electronically Signed   By: Kathreen Devoid   On: 12/24/2018 11:13    ____________________________________________   PROCEDURES  Procedure(s) performed: None  Procedures  Critical Care performed: No ____________________________________________   INITIAL IMPRESSION  / ASSESSMENT AND PLAN / ED COURSE  Pertinent labs & imaging results that were available during my care of the patient were reviewed by me and considered in my medical decision making (see chart for details).  43 y.o. female with obesity presenting with palpitations and chest pain.  Overall, the patient does not have significant cardiac risk factors.  Her EKG is reassuring with out ischemic changes or arrhythmia.  We will get a troponin, which has been drawn more than 4 hours from the onset of symptoms.  Thyroid panel has also been added to her basic laboratory studies.  The patient does not have any evidence of DVT, hypoxia or pleuritic component to her chest pain; PE is considered but very unlikely.  The patient's chest x-ray is reassuring without any cardiopulmonary disease.  Plan reevaluation for final disposition.  ----------------------------------------- 12:19 PM on 12/24/2018 -----------------------------------------  Patient's work-up in the emergency department has been reassuring.  She will be discharged home with close follow-up.  Return precautions were discussed. ____________________________________________  FINAL CLINICAL IMPRESSION(S) / ED DIAGNOSES  Final diagnoses:  Palpitations  Chest pain, unspecified type  NEW MEDICATIONS STARTED DURING THIS VISIT:  New Prescriptions   No medications on file      Eula Listen, MD 12/24/18 1146    Eula Listen, MD 12/24/18 [phone]

## 2018-12-24 NOTE — Discharge Instructions (Addendum)
Please drink plenty of fluid to stay well-hydrated, and eat small healthy regular meals throughout the day.  Please make an appoint with a cardiologist for reevaluation of your palpitations and chest pain.  Please make a follow-up appointment with your regular doctor for your symptoms; they can follow-up the results of your thyroid testing from today.  Return to the emergency department if you develop severe pain, lightheadedness or fainting, sweaty or clammy feeling, nausea or vomiting, fever, or any other symptoms concerning to you.

## 2018-12-24 NOTE — ED Triage Notes (Signed)
Awoke with sensation of rapid heart rate. States had chest tightness then but none now.

## 2019-03-25 ENCOUNTER — Other Ambulatory Visit: Payer: Self-pay | Admitting: Family Medicine

## 2019-03-25 DIAGNOSIS — Z1231 Encounter for screening mammogram for malignant neoplasm of breast: Secondary | ICD-10-CM

## 2019-03-28 ENCOUNTER — Ambulatory Visit
Admission: RE | Admit: 2019-03-28 | Discharge: 2019-03-28 | Disposition: A | Discharge status: Home / Self Care | Payer: BC Managed Care – PPO | Source: Ambulatory Visit | Attending: Family Medicine | Admitting: Family Medicine

## 2019-03-28 ENCOUNTER — Other Ambulatory Visit: Payer: Self-pay

## 2019-03-28 DIAGNOSIS — Z1231 Encounter for screening mammogram for malignant neoplasm of breast: Secondary | ICD-10-CM | POA: Insufficient documentation

## 2020-04-01 ENCOUNTER — Other Ambulatory Visit: Payer: Self-pay | Admitting: Family Medicine

## 2020-04-24 ENCOUNTER — Other Ambulatory Visit: Payer: Self-pay | Admitting: Family Medicine

## 2020-04-24 DIAGNOSIS — Z1231 Encounter for screening mammogram for malignant neoplasm of breast: Secondary | ICD-10-CM

## 2020-05-22 ENCOUNTER — Encounter: Payer: Self-pay | Admitting: Obstetrics and Gynecology

## 2020-05-22 ENCOUNTER — Ambulatory Visit (INDEPENDENT_AMBULATORY_CARE_PROVIDER_SITE_OTHER): Payer: BC Managed Care – PPO | Admitting: Obstetrics and Gynecology

## 2020-05-22 ENCOUNTER — Other Ambulatory Visit (HOSPITAL_COMMUNITY)
Admission: RE | Admit: 2020-05-22 | Discharge: 2020-05-22 | Disposition: A | Discharge status: Home / Self Care | Payer: BC Managed Care – PPO | Source: Ambulatory Visit | Attending: Obstetrics and Gynecology | Admitting: Obstetrics and Gynecology

## 2020-05-22 ENCOUNTER — Other Ambulatory Visit: Payer: Self-pay

## 2020-05-22 VITALS — BP 112/74 | HR 91 | Ht 66.0 in | Wt 252.0 lb

## 2020-05-22 DIAGNOSIS — R87810 Cervical high risk human papillomavirus (HPV) DNA test positive: Secondary | ICD-10-CM | POA: Insufficient documentation

## 2020-05-22 DIAGNOSIS — R87610 Atypical squamous cells of undetermined significance on cytologic smear of cervix (ASC-US): Secondary | ICD-10-CM | POA: Diagnosis present

## 2020-05-22 NOTE — Progress Notes (Signed)
Obstetrics & Gynecology Office Visit   Chief Complaint:  Chief Complaint  Patient presents with  . Colposcopy    Referred by [facility] for abnormal pap    History of Present Illness:Morgan Murphy is a 44 y.o. woman who presents today in consultation at the request of Dr. Gayland Curry [facility] in [facility] for continued surveillance for history of dysplasia. Last pap obtained on 04/13/2020 revealed ASCUS HPV positive.  Prior history of ASCUS HPV positive paps in the past.  She presents today for colposcopy for further work up of this pap.  Pap History: 04/13/2020 ASCUS HPV positive 03/01/2013 ASCUS HPV positive colposcopy results not available 12/14/2011 ASCUS HPV negative      Review of Systems: Review of Systems  Constitutional: Negative.   Genitourinary: Negative.   Skin: Negative.      Past Medical History:  Patient Active Problem List   Diagnosis Date Noted  . Post-op pain 08/19/2016  . Fibroids 08/18/2016  . Menorrhagia 08/18/2016    Past Surgical History:  Patient Active Problem List   Diagnosis Date Noted  . Post-op pain 08/19/2016  . Fibroids 08/18/2016  . Menorrhagia 08/18/2016    Gynecologic History: Patient's last menstrual period was 04/26/2020.  Obstetric History: [id number]  Family History:  History reviewed. No pertinent family history.  Social History:  Social History   Socioeconomic History  . Marital status: Divorced    Spouse name: Not on file  . Number of children: Not on file  . Years of education: Not on file  . Highest education level: Not on file  Occupational History  . Not on file  Tobacco Use  . Smoking status: Never Smoker  . Smokeless tobacco: Never Used  Vaping Use  . Vaping Use: Never used  Substance and Sexual Activity  . Alcohol use: Yes    Alcohol/week: 2.0 standard drinks    Types: 2 Shots of liquor per week  . Drug use: Yes    Types: Marijuana  . Sexual activity: Yes    Birth  control/protection: Condom  Other Topics Concern  . Not on file  Social History Narrative  . Not on file   Social Determinants of Health   Financial Resource Strain:   . Difficulty of Paying Living Expenses: Not on file  Food Insecurity:   . Worried About Charity fundraiser in the Last Year: Not on file  . Ran Out of Food in the Last Year: Not on file  Transportation Needs:   . Lack of Transportation (Medical): Not on file  . Lack of Transportation (Non-Medical): Not on file  Physical Activity:   . Days of Exercise per Week: Not on file  . Minutes of Exercise per Session: Not on file  Stress:   . Feeling of Stress : Not on file  Social Connections:   . Frequency of Communication with Friends and Family: Not on file  . Frequency of Social Gatherings with Friends and Family: Not on file  . Attends Religious Services: Not on file  . Active Member of Clubs or Organizations: Not on file  . Attends Archivist Meetings: Not on file  . Marital Status: Not on file  Intimate Partner Violence:   . Fear of Current or Ex-Partner: Not on file  . Emotionally Abused: Not on file  . Physically Abused: Not on file  . Sexually Abused: Not on file    Allergies:  No Known Allergies  Medications: Prior  to Admission medications   Medication Sig Start Date End Date Taking? Authorizing Provider  Ascorbic Acid (VITAMIN C) 1000 MG tablet Take 1,000 Units by mouth daily.   Yes [provider]  Cholecalciferol (VITAMIN D-1000 MAX ST) 25 MCG (1000 UT) tablet Take 4,000 Units by mouth daily.   Yes [provider]  SAXENDA 18 MG/3ML SOPN Inject into the skin. 05/01/20  Yes [provider]  sertraline (ZOLOFT) 25 MG tablet Take 25 mg by mouth at bedtime. 04/13/20  Yes [provider]  ibuprofen (ADVIL,MOTRIN) 200 MG tablet Take 400 mg by mouth every 8 (eight) hours as needed (for pain.).    [provider]    Physical Exam Vitals:  Vitals:    05/22/20 1037  BP: 112/74  Pulse: 91   Patient's last menstrual period was 04/26/2020.  General: NAD, well nourished appears stated age 5: normocephalic, anicteric Pulmonary: No increased work of breathing Neurologic: Grossly intact Psychiatric: mood appropriate, affect full  Female chaperone present for pelvic and breast  portions of the physical exam   [place] NOTE  44 y.o. [id number] here for colposcopy for ASCUS with POSITIVE high risk HPV  pap smear on 04/13/2020. Discussed underlying role for HPV infection in the development of cervical dysplasia, its natural history and progression/regression, need for surveillance.  Is the patient  pregnant: No LMP: Patient's last menstrual period was 04/26/2020. Smoking status:  reports that she has never smoked. She has never used smokeless tobacco.  Patient given informed consent, signed copy in the chart, time out was performed.  The patient was position in dorsal lithotomy position. Speculum was placed the cervix was visualized.   After application of acetic acid colposcopic inspection of the cervix was undertaken.   Colposcopy adequate, full visualization of transformation zone: Yes acetowhite lesion(s) noted at 7 o'clock; corresponding biopsies obtained.   ECC specimen obtained:  Yes  All specimens were labeled and sent to pathology.   Patient was given post procedure instructions.  Will follow up pathology and manage accordingly.  Routine preventative health maintenance measures emphasized.  OBGyn Exam   Assessment: 44 y.o. G3P0011 follow up for ASCUS HPV positive pap  Plan: Problem List Items Addressed This Visit    None    Visit Diagnoses    ASCUS with positive high risk HPV cervical    -  Primary   Relevant Orders   Surgical pathology      - colposcopy performed  - I had a lengthly discussion with Morgan Murphy  regarding the cause of dysplasia of the lower genital tract (including  immunosuppression in the setting of HPV exposure and tobacco exposure). I explained the potential for progression to invasive malignancy, the recurrent nature of these lesions (and the need for close continued followup). Results of today's pap will dictate need for further evaluation and follow up per ASCCP guidelines..  We discussed that 80% of the population will have exposure to human papilloma virus (HPV) during their lifetime.  HPV is a large group of viruses, and are also the causative virus for common warts and genital warts.  The pap smear tests for 13 high risk HPV strains that have some association with cervical cancer, but do not cause visual lesion such as warts.  HPV type 16 and 18 have the highest association with cervical cancer.  The vast majority of HPV infections will be uncomplicated at clear spontaneously in 12-18 months in non-immunocompromised patients.  Patient with compromised immune systems,  those taking immunosuppressive drugs, or smoker have shown to have a lower clearance rate and higher persistence of HPV infection.    Currently there are no FDA approved treatments to promote HPV clearance.  Gardasil vaccination is available to prevent HPV infection, but this is only beneficial pre-exposure.  Abstaining from intercourse will not increase clearance  Lastly we stressed that if properly followed HPV should not lead to cervical cancer.   The goal of screening is to identify patient who develop precancerous lesions of the cervix and treat these prior to progression to frank cervical cancer.  The incidence of cervical cancer is 7 cases per 100,000 women in the [place] a year.  This relatively low rate is in part due to universal screening as well as vaccination efforts.     - She is comfortable with the plan and had her questions answered.  - A total of 15 minutes were spent in face-to-face contact with the patient during this encounter with over half of that time devoted to counseling  and coordination of care.  - Return in about 1 year (around 05/22/2021) for Annual/pap.   Malachy Mood, MD, [facility], [facility] 05/22/2020, 11:15 AM

## 2020-05-22 NOTE — Patient Instructions (Signed)
80% of the population will have exposure to human papilloma virus (HPV) during their lifetime.  HPV is a large group of viruses, and are also the causative virus for common warts and genital warts.  The pap smear tests for 13 high risk HPV strains that have some association with cervical cancer, but do not cause visual lesion such as warts.  HPV type 16 and 18 have the highest association with cervical cancer.  The vast majority of HPV infections will be uncomplicated at clear spontaneously in 12-18 months in non-immunocompromised patients.  Patient with compromised immune systems, those taking immunosuppressive drugs, or smoker have shown to have a lower clearance rate and higher persistence of HPV infection.    Currently there are no FDA approved treatments to promote HPV clearance.  Gardasil vaccination is available to prevent HPV infection, but this is only beneficial pre-exposure.  Abstaining from intercourse will not increase clearance  If properly followed HPV should not lead to cervical cancer.   The goal of screening is to identify patient who develop precancerous lesions of the cervix and treat these prior to progression to frank cervical cancer.  The incidence of cervical cancer is 7 cases per 100,000 women in the [place] a year.  This relatively low rate is in part due to universal screening as well as vaccination efforts.

## 2020-05-25 LAB — SURGICAL PATHOLOGY

## 2020-06-22 ENCOUNTER — Other Ambulatory Visit: Payer: Self-pay

## 2020-06-22 ENCOUNTER — Ambulatory Visit
Admission: RE | Admit: 2020-06-22 | Discharge: 2020-06-22 | Disposition: A | Discharge status: Home / Self Care | Payer: BC Managed Care – PPO | Source: Ambulatory Visit | Attending: Family Medicine | Admitting: Family Medicine

## 2020-06-22 DIAGNOSIS — Z1231 Encounter for screening mammogram for malignant neoplasm of breast: Secondary | ICD-10-CM | POA: Diagnosis present

## 2020-06-29 ENCOUNTER — Other Ambulatory Visit: Payer: Self-pay | Admitting: Family Medicine

## 2020-06-29 DIAGNOSIS — N6310 Unspecified lump in the right breast, unspecified quadrant: Secondary | ICD-10-CM

## 2020-06-29 DIAGNOSIS — R928 Other abnormal and inconclusive findings on diagnostic imaging of breast: Secondary | ICD-10-CM

## 2020-07-01 ENCOUNTER — Other Ambulatory Visit: Payer: Self-pay

## 2020-07-01 ENCOUNTER — Ambulatory Visit
Admission: RE | Admit: 2020-07-01 | Discharge: 2020-07-01 | Disposition: A | Discharge status: Home / Self Care | Payer: BC Managed Care – PPO | Source: Ambulatory Visit | Attending: Family Medicine | Admitting: Family Medicine

## 2020-07-01 DIAGNOSIS — R928 Other abnormal and inconclusive findings on diagnostic imaging of breast: Secondary | ICD-10-CM

## 2020-07-01 DIAGNOSIS — N6310 Unspecified lump in the right breast, unspecified quadrant: Secondary | ICD-10-CM | POA: Diagnosis present

## 2021-06-29 ENCOUNTER — Ambulatory Visit: Payer: BC Managed Care – PPO | Admitting: Obstetrics and Gynecology

## 2021-07-20 ENCOUNTER — Other Ambulatory Visit: Payer: Self-pay | Admitting: Family Medicine

## 2021-07-20 DIAGNOSIS — Z1231 Encounter for screening mammogram for malignant neoplasm of breast: Secondary | ICD-10-CM

## 2021-07-26 ENCOUNTER — Ambulatory Visit: Payer: BC Managed Care – PPO | Admitting: Obstetrics and Gynecology

## 2021-08-04 ENCOUNTER — Ambulatory Visit
Admission: RE | Admit: 2021-08-04 | Discharge: 2021-08-04 | Disposition: A | Discharge status: Home / Self Care | Payer: BC Managed Care – PPO | Source: Ambulatory Visit | Attending: Family Medicine | Admitting: Family Medicine

## 2021-08-04 ENCOUNTER — Other Ambulatory Visit: Payer: Self-pay

## 2021-08-04 DIAGNOSIS — Z1231 Encounter for screening mammogram for malignant neoplasm of breast: Secondary | ICD-10-CM | POA: Insufficient documentation

## 2021-09-07 ENCOUNTER — Ambulatory Visit: Payer: BC Managed Care – PPO | Admitting: Obstetrics and Gynecology

## 2021-10-06 ENCOUNTER — Ambulatory Visit: Payer: BC Managed Care – PPO | Admitting: Obstetrics and Gynecology

## 2021-10-21 ENCOUNTER — Other Ambulatory Visit (HOSPITAL_COMMUNITY)
Admission: RE | Admit: 2021-10-21 | Discharge: 2021-10-21 | Disposition: A | Discharge status: Home / Self Care | Payer: BC Managed Care – PPO | Source: Ambulatory Visit | Attending: Obstetrics and Gynecology | Admitting: Obstetrics and Gynecology

## 2021-10-21 ENCOUNTER — Encounter: Payer: Self-pay | Admitting: Obstetrics and Gynecology

## 2021-10-21 ENCOUNTER — Other Ambulatory Visit: Payer: Self-pay

## 2021-10-21 ENCOUNTER — Ambulatory Visit (INDEPENDENT_AMBULATORY_CARE_PROVIDER_SITE_OTHER): Payer: BC Managed Care – PPO | Admitting: Obstetrics and Gynecology

## 2021-10-21 VITALS — BP 120/90 | Ht 66.0 in | Wt 272.0 lb

## 2021-10-21 DIAGNOSIS — Z1151 Encounter for screening for human papillomavirus (HPV): Secondary | ICD-10-CM | POA: Diagnosis not present

## 2021-10-21 DIAGNOSIS — B9689 Other specified bacterial agents as the cause of diseases classified elsewhere: Secondary | ICD-10-CM

## 2021-10-21 DIAGNOSIS — N870 Mild cervical dysplasia: Secondary | ICD-10-CM | POA: Diagnosis present

## 2021-10-21 DIAGNOSIS — R87810 Cervical high risk human papillomavirus (HPV) DNA test positive: Secondary | ICD-10-CM | POA: Diagnosis present

## 2021-10-21 DIAGNOSIS — Z1211 Encounter for screening for malignant neoplasm of colon: Secondary | ICD-10-CM

## 2021-10-21 DIAGNOSIS — Z124 Encounter for screening for malignant neoplasm of cervix: Secondary | ICD-10-CM

## 2021-10-21 DIAGNOSIS — Z1231 Encounter for screening mammogram for malignant neoplasm of breast: Secondary | ICD-10-CM

## 2021-10-21 DIAGNOSIS — R87610 Atypical squamous cells of undetermined significance on cytologic smear of cervix (ASC-US): Secondary | ICD-10-CM | POA: Diagnosis not present

## 2021-10-21 DIAGNOSIS — Z01419 Encounter for gynecological examination (general) (routine) without abnormal findings: Secondary | ICD-10-CM

## 2021-10-21 DIAGNOSIS — N760 Acute vaginitis: Secondary | ICD-10-CM

## 2021-10-21 LAB — POCT WET PREP WITH KOH
KOH Prep POC: NEGATIVE
Trichomonas, UA: NEGATIVE
Yeast Wet Prep HPF POC: NEGATIVE

## 2021-10-21 MED ORDER — METRONIDAZOLE 500 MG PO TABS: ORAL_TABLET | ORAL | 0 refills | Status: DC

## 2021-10-21 MED ORDER — FLUCONAZOLE 150 MG PO TABS: 150.0000 mg | ORAL_TABLET | Freq: Once | ORAL | 0 refills | Status: AC

## 2021-10-21 NOTE — Progress Notes (Signed)
PCP: Gayland Curry, MD   Chief Complaint  Patient presents with   Gynecologic Exam    Itchiness, irritation, fishy odor x 2 days    HPI:      Morgan Murphy is a 46 y.o. G3P0011 whose LMP was Patient's last menstrual period was 10/14/2021 (approximate)., presents today for her annual examination.  Her menses are regular every 28-30 days, lasting 4-5 days.  Dysmenorrhea mild, occurring first 1-2 days of flow. She does not have intermenstrual bleeding.  Sex activity: single partner, contraception - condoms mostly, declines other BC. She does have vaginal dryness occas, improved with lubricants.  Pt has noticed an increased vag d/c, fishy odor, mild irritation for the past 2 days. No meds to treat. Hx of chronic BV, no sx for past 6 months. Has tried boric acid supp with sx relief.   Last Pap: 04/13/20 Results were: ASCUS with POSITIVE high risk HPV ; CIN 1 on colpo with Dr. Georgianne Fick 8/21. Repeat pap due today.  Hx of STDs: HPV  Last mammogram: 08/04/21  Results were: normal--routine follow-up in 12 months There is no FH of breast cancer. There is no FH of ovarian cancer. The patient does not do self-breast exams.  Colonoscopy:  a few yrs ago due to GI issues; has GI ref placed, is waiting to be sheds.   Tobacco use: The patient denies current or previous tobacco use. Alcohol use: social drinker No drug use Exercise: min active  She does get adequate calcium and Vitamin D in her diet.  Labs with PCP.   Patient Active Problem List   Diagnosis Date Noted   Post-op pain 08/19/2016   Fibroids 08/18/2016   Menorrhagia 08/18/2016    Past Surgical History:  Procedure Laterality Date   GANGLION CYST EXCISION     LAPAROTOMY N/A 08/18/2016   Procedure: LAPAROTOMY;  Surgeon: Gae Dry, MD;  Location: [facility] ORS;  Service: Gynecology;  Laterality: N/A;   MYOMECTOMY N/A 08/18/2016   Procedure: MYOMECTOMY;  Surgeon: Gae Dry, MD;  Location: [facility] ORS;  Service:  Gynecology;  Laterality: N/A;    History reviewed. No pertinent family history.  Social History   Socioeconomic History   Marital status: Divorced    Spouse name: Not on file   Number of children: Not on file   Years of education: Not on file   Highest education level: Not on file  Occupational History   Not on file  Tobacco Use   Smoking status: Never   Smokeless tobacco: Never  Vaping Use   Vaping Use: Never used  Substance and Sexual Activity   Alcohol use: Yes    Alcohol/week: 2.0 standard drinks    Types: 2 Shots of liquor per week   Drug use: Yes    Types: Marijuana   Sexual activity: Yes    Birth control/protection: Condom, None  Other Topics Concern   Not on file  Social History Narrative   Not on file   Social Determinants of Health   Financial Resource Strain: Not on file  Food Insecurity: Not on file  Transportation Needs: Not on file  Physical Activity: Not on file  Stress: Not on file  Social Connections: Not on file  Intimate Partner Violence: Not on file     Current Outpatient Medications:    Ascorbic Acid (VITAMIN C) 1000 MG tablet, Take 1,000 Units by mouth daily., Disp: , Rfl:    Cholecalciferol 25 MCG (1000 UT) tablet, Take 4,000 Units by  mouth daily., Disp: , Rfl:    fluconazole (DIFLUCAN) 150 MG tablet, Take 1 tablet (150 mg total) by mouth once for 1 dose., Disp: 1 tablet, Rfl: 0   metroNIDAZOLE (FLAGYL) 500 MG tablet, Take 1 tab BID for 7 days; NO alcohol use for 10 days after prescription start, Disp: 14 tablet, Rfl: 0   sertraline (ZOLOFT) 25 MG tablet, Take 25 mg by mouth at bedtime., Disp: , Rfl:      ROS:  Review of Systems  Constitutional:  Negative for fatigue, fever and unexpected weight change.  Respiratory:  Negative for cough, shortness of breath and wheezing.   Cardiovascular:  Negative for chest pain, palpitations and leg swelling.  Gastrointestinal:  Negative for blood in stool, constipation, diarrhea, nausea and  vomiting.  Endocrine: Negative for cold intolerance, heat intolerance and polyuria.  Genitourinary:  Positive for vaginal discharge. Negative for dyspareunia, dysuria, flank pain, frequency, genital sores, hematuria, menstrual problem, pelvic pain, urgency, vaginal bleeding and vaginal pain.  Musculoskeletal:  Positive for arthralgias. Negative for back pain, joint swelling and myalgias.  Skin:  Negative for rash.  Neurological:  Negative for dizziness, syncope, light-headedness, numbness and headaches.  Hematological:  Negative for adenopathy.  Psychiatric/Behavioral:  Negative for agitation, confusion, sleep disturbance and suicidal ideas. The patient is not nervous/anxious.   BREAST: No symptoms    Objective: BP 120/90    Ht 5\' 6"  (1.676 m)    Wt 272 lb (123.4 kg)    LMP 10/14/2021 (Approximate)    BMI 43.90 kg/m    Physical Exam Constitutional:      Appearance: She is well-developed.  Genitourinary:     Vulva normal.     Right Labia: No rash, tenderness or lesions.    Left Labia: No tenderness, lesions or rash.    No vaginal discharge, erythema or tenderness.      Right Adnexa: not tender and no mass present.    Left Adnexa: not tender and no mass present.    No cervical friability or polyp.     Uterus is not enlarged or tender.  Breasts:    Right: No mass, nipple discharge, skin change or tenderness.     Left: No mass, nipple discharge, skin change or tenderness.  Neck:     Thyroid: No thyromegaly.  Cardiovascular:     Rate and Rhythm: Normal rate and regular rhythm.     Heart sounds: Normal heart sounds. No murmur heard. Pulmonary:     Effort: Pulmonary effort is normal.     Breath sounds: Normal breath sounds.  Abdominal:     Palpations: Abdomen is soft.     Tenderness: There is no abdominal tenderness. There is no guarding or rebound.  Musculoskeletal:        General: Normal range of motion.     Cervical back: Normal range of motion.  Lymphadenopathy:      Cervical: No cervical adenopathy.  Neurological:     General: No focal deficit present.     Mental Status: She is alert and oriented to person, place, and time.     Cranial Nerves: No cranial nerve deficit.  Skin:    General: Skin is warm and dry.  Psychiatric:        Mood and Affect: Mood normal.        Behavior: Behavior normal.        Thought Content: Thought content normal.        Judgment: Judgment normal.  Vitals reviewed.  Results: Results for orders placed or performed in visit on 10/21/21 (from the past 24 hour(s))  POCT Wet Prep with KOH     Status: Abnormal   Collection Time: 10/21/21  3:24 PM  Result Value Ref Range   Trichomonas, UA Negative    Clue Cells Wet Prep HPF POC few    Epithelial Wet Prep HPF POC     Yeast Wet Prep HPF POC neg    Bacteria Wet Prep HPF POC     RBC Wet Prep HPF POC     WBC Wet Prep HPF POC     KOH Prep POC Negative Negative    Assessment/Plan:  Encounter for annual routine gynecological examination  Cervical cancer screening - Plan: Cytology - PAP  Screening for HPV (human papillomavirus) - Plan: Cytology - PAP  ASCUS with positive high risk HPV cervical - Plan: Cytology - PAP  Dysplasia of cervix, low grade (CIN 1) - Plan: Cytology - PAP; repeat pap today, will f/u with results.   Encounter for screening mammogram for malignant neoplasm of breast; pt current on mammo  Screening for colon cancer--pt getting sheds with GI.   BV (bacterial vaginosis) - Plan: metroNIDAZOLE (FLAGYL) 500 MG tablet, fluconazole (DIFLUCAN) 150 MG tablet, POCT Wet Prep with KOH; pos sx and wet prep. Rx flagyl, no EtOH. Rx diflucan prn yeast vag sx.    Meds ordered this encounter  Medications   metroNIDAZOLE (FLAGYL) 500 MG tablet    Sig: Take 1 tab BID for 7 days; NO alcohol use for 10 days after prescription start    Dispense:  14 tablet    Refill:  0    Order Specific Question:   Supervising Provider    Answer:   Gae Dry [[id number]]    fluconazole (DIFLUCAN) 150 MG tablet    Sig: Take 1 tablet (150 mg total) by mouth once for 1 dose.    Dispense:  1 tablet    Refill:  0    Order Specific Question:   Supervising Provider    Answer:   Gae Dry [[id number]]            GYN counsel breast self exam, mammography screening, adequate intake of calcium and vitamin D, diet and exercise    F/U  Return in about 1 year (around 10/21/2022).  Shron Ozer B. Martesha Niedermeier, PA-C 10/21/2021 3:40 PM

## 2021-10-21 NOTE — Patient Instructions (Signed)
I value your feedback and you entrusting us with your care. If you get a [facility] patient survey, I would appreciate you taking the time to let us know about your experience today. Thank you! ? ? ?

## 2021-10-25 LAB — CYTOLOGY - PAP
Adequacy: ABSENT
Comment: NEGATIVE
Diagnosis: NEGATIVE
High risk HPV: NEGATIVE

## 2021-12-15 IMAGING — MG MM DIGITAL SCREENING BILAT W/ TOMO AND CAD
8 series · 8 of 24 positions shown · non-contrast
Comparison: Previous exam(s).

CLINICAL DATA: Screening.

EXAM:
DIGITAL SCREENING BILATERAL MAMMOGRAM WITH TOMOSYNTHESIS AND CAD
TECHNIQUE: Bilateral screening digital craniocaudal and mediolateral oblique
mammograms were obtained. Bilateral screening digital breast
tomosynthesis was performed. The images were evaluated with
computer-aided detection.

[R MLO synth-2D]
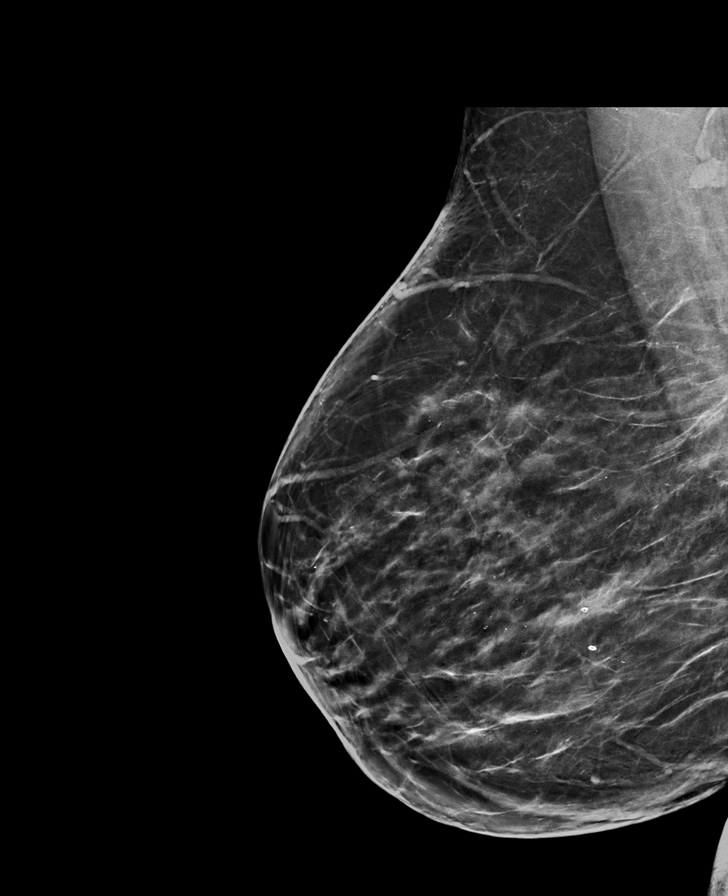

[L CC synth-2D]
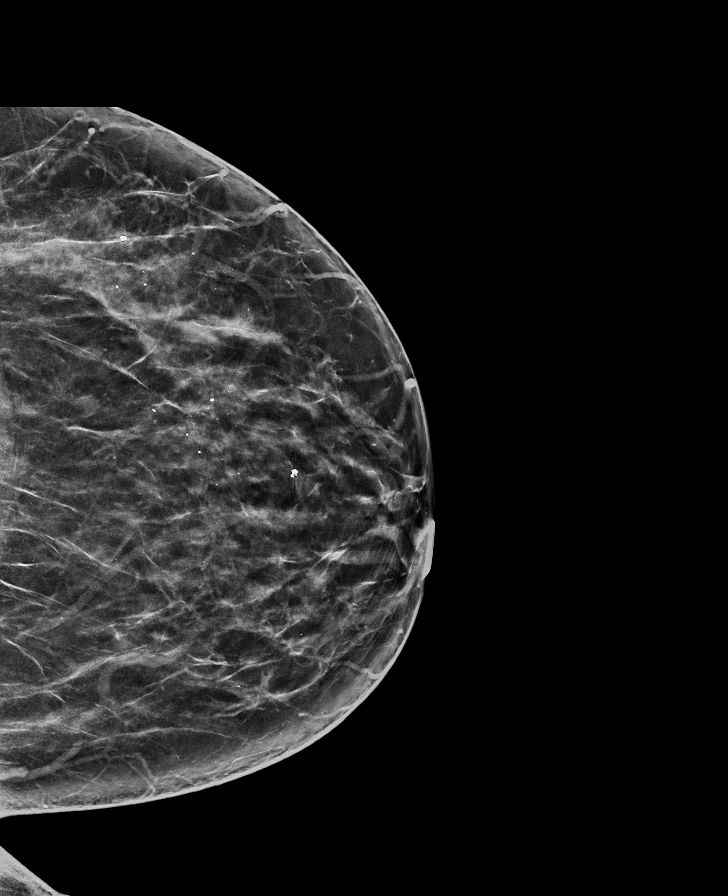

[L MLO synth-2D]
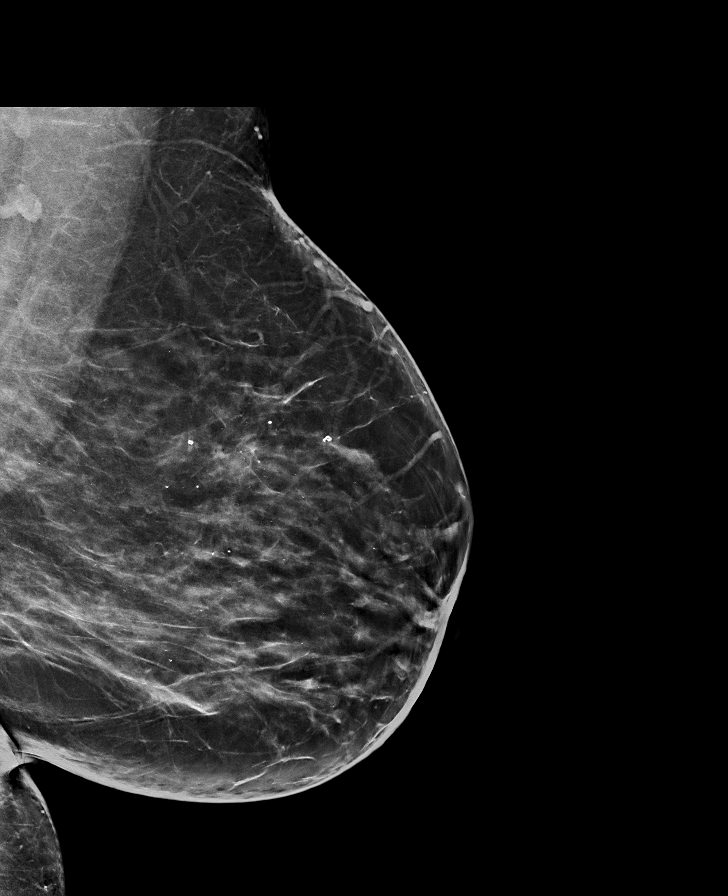

[R CC synth-2D]
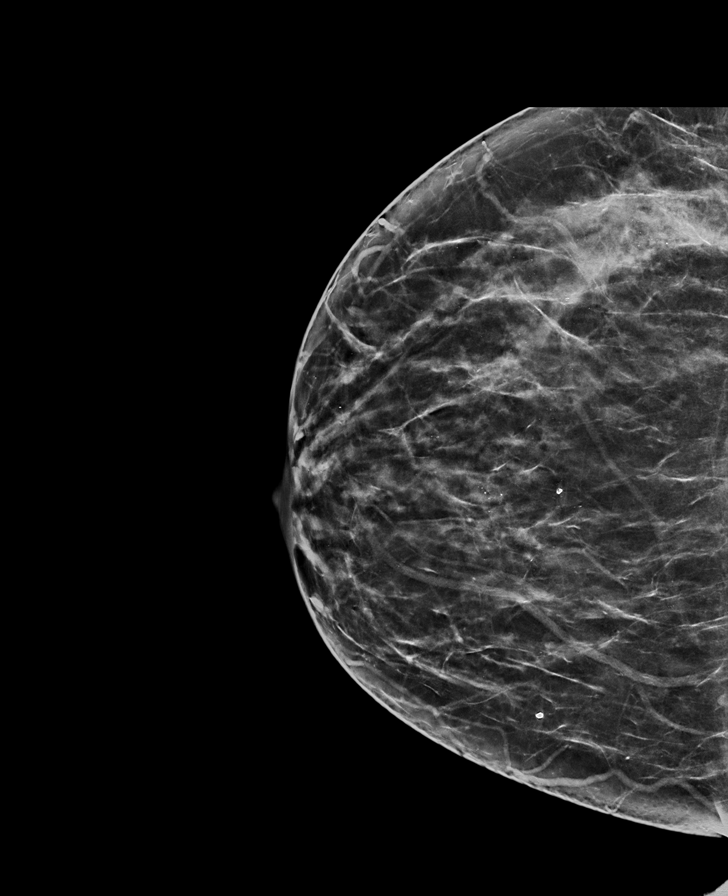

[L MLO tomo · tomo slice 45/88.0]
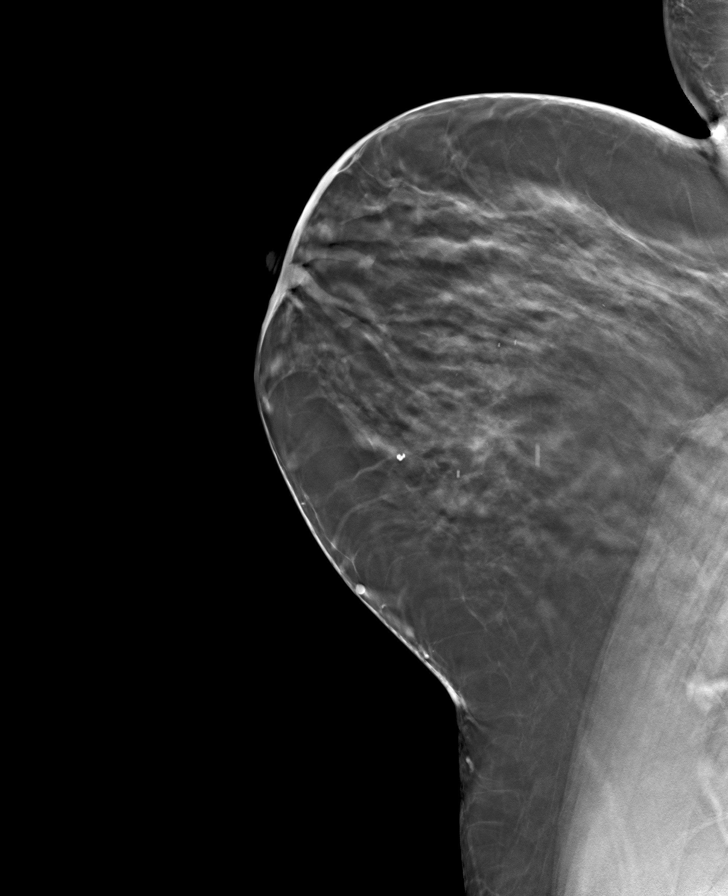

[R MLO tomo · tomo slice 43/84.0]
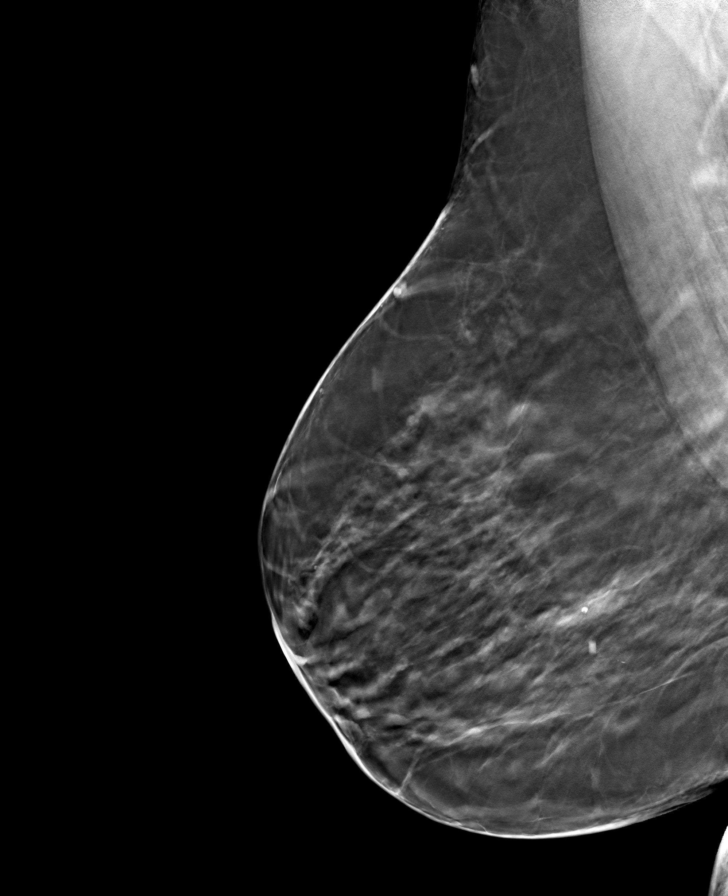

[R CC tomo · tomo slice 35/70.0]
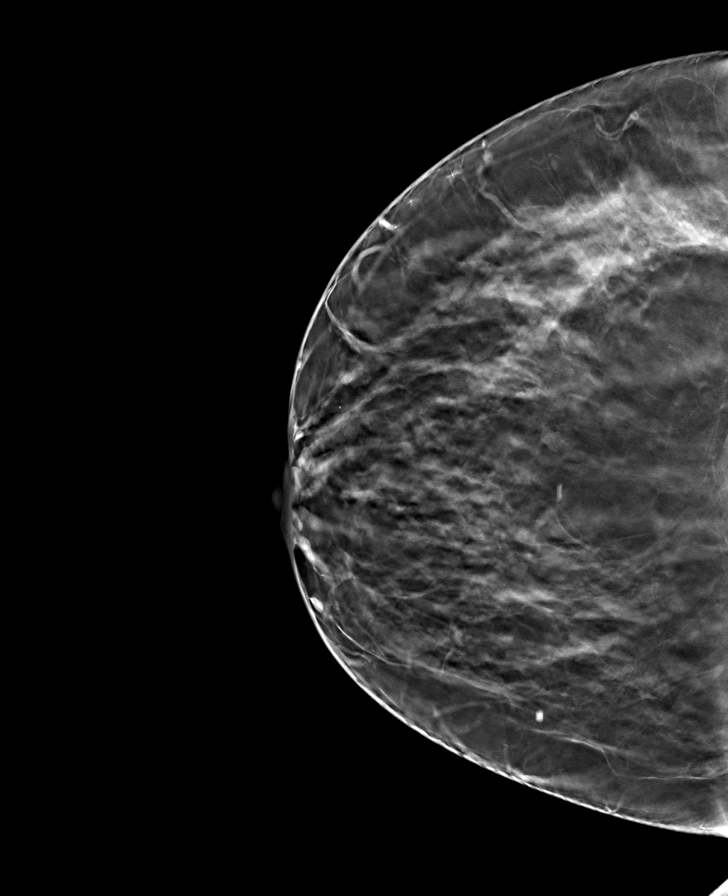

[L CC tomo · tomo slice 37/74.0]
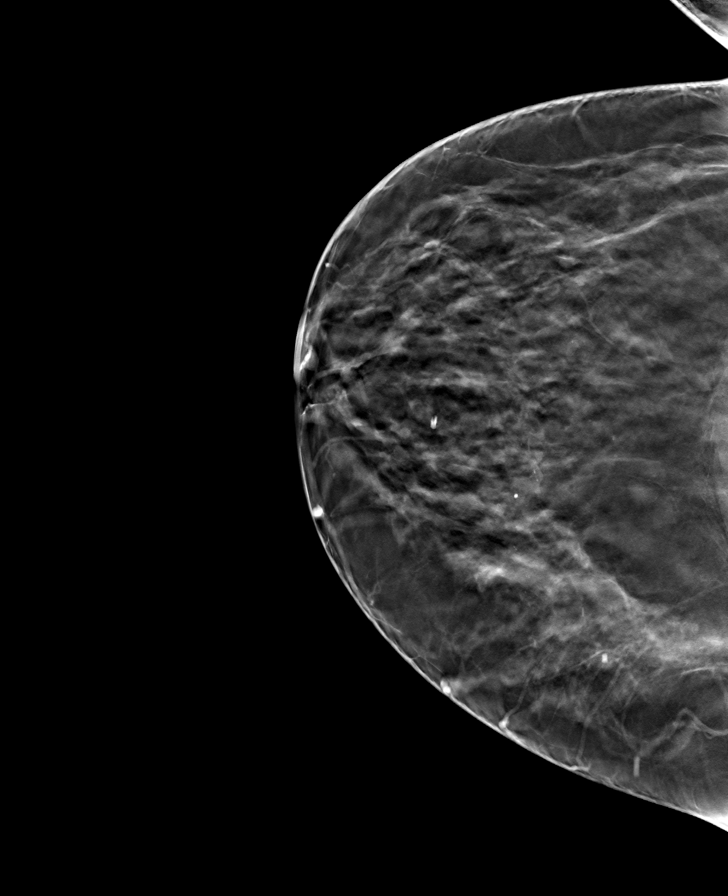

[8 of 24 positions shown; findings below may reference images not displayed]

ACR Breast Density Category c: The breast tissue is heterogeneously
dense, which may obscure small masses.
FINDINGS: There are no findings suspicious for malignancy.
IMPRESSION: No mammographic evidence of malignancy. A result letter of this
screening mammogram will be mailed directly to the patient.

RECOMMENDATION:
Screening mammogram in one year. (Code:Q3-W-BC3)

BI-RADS CATEGORY  1: Negative.

## 2022-05-17 ENCOUNTER — Ambulatory Visit: Payer: BC Managed Care – PPO | Admitting: Obstetrics & Gynecology

## 2022-05-23 ENCOUNTER — Other Ambulatory Visit: Payer: Self-pay | Admitting: Family Medicine

## 2022-05-23 DIAGNOSIS — R7989 Other specified abnormal findings of blood chemistry: Secondary | ICD-10-CM

## 2022-05-25 ENCOUNTER — Ambulatory Visit (HOSPITAL_COMMUNITY)
Admission: RE | Admit: 2022-05-25 | Discharge: 2022-05-25 | Disposition: A | Discharge status: Home / Self Care | Payer: BC Managed Care – PPO | Source: Ambulatory Visit | Attending: Family Medicine | Admitting: Family Medicine

## 2022-05-25 DIAGNOSIS — R7989 Other specified abnormal findings of blood chemistry: Secondary | ICD-10-CM | POA: Diagnosis not present

## 2022-06-24 ENCOUNTER — Other Ambulatory Visit: Payer: Self-pay

## 2022-06-24 ENCOUNTER — Telehealth: Payer: Self-pay

## 2022-06-24 DIAGNOSIS — Z1211 Encounter for screening for malignant neoplasm of colon: Secondary | ICD-10-CM

## 2022-06-24 MED ORDER — NA SULFATE-K SULFATE-MG SULF 17.5-3.13-1.6 GM/177ML PO SOLN: 1.0000 | Freq: Once | ORAL | 0 refills | Status: AC

## 2022-06-24 NOTE — Telephone Encounter (Signed)
Gastroenterology Pre-Procedure Review  Request Date: 07/13/22 Requesting Physician: Dr. Vicente Males  PATIENT REVIEW QUESTIONS: The patient responded to the following health history questions as indicated:    1. Are you having any GI issues? no 2. Do you have a personal history of Polyps? no 3. Do you have a family history of Colon Cancer or Polyps? no 4. Diabetes Mellitus? no 5. Joint replacements in the past 12 months?no 6. Major health problems in the past 3 months?no 7. Any artificial heart valves, MVP, or defibrillator?no    MEDICATIONS & ALLERGIES:    Patient reports the following regarding taking any anticoagulation/antiplatelet therapy:   Plavix, Coumadin, Eliquis, Xarelto, Lovenox, Pradaxa, Brilinta, or Effient? no Aspirin? no  Patient confirms/reports the following medications:  Current Outpatient Medications  Medication Sig Dispense Refill   Ascorbic Acid (VITAMIN C) 1000 MG tablet Take 1,000 Units by mouth daily.     Cholecalciferol 25 MCG (1000 UT) tablet Take 4,000 Units by mouth daily.     metroNIDAZOLE (FLAGYL) 500 MG tablet Take 1 tab BID for 7 days; NO alcohol use for 10 days after prescription start 14 tablet 0   sertraline (ZOLOFT) 25 MG tablet Take 25 mg by mouth at bedtime.     No current facility-administered medications for this visit.    Patient confirms/reports the following allergies:  No Known Allergies  No orders of the defined types were placed in this encounter.   AUTHORIZATION INFORMATION Primary Insurance: 1D#: Group #:  Secondary Insurance: 1D#: Group #:  SCHEDULE INFORMATION: Date: 07/13/22 Time: Location: [facility]

## 2022-07-13 ENCOUNTER — Encounter: Payer: Self-pay | Admitting: Gastroenterology

## 2022-07-13 ENCOUNTER — Ambulatory Visit: Payer: BC Managed Care – PPO | Admitting: Anesthesiology

## 2022-07-13 ENCOUNTER — Encounter
Admission: RE | Disposition: A | Discharge status: Home / Self Care | Payer: Self-pay | Source: Home / Self Care | Attending: Gastroenterology

## 2022-07-13 ENCOUNTER — Ambulatory Visit
Admission: RE | Admit: 2022-07-13 | Discharge: 2022-07-13 | Disposition: A | Discharge status: Home / Self Care | Payer: BC Managed Care – PPO | Attending: Gastroenterology | Admitting: Gastroenterology

## 2022-07-13 ENCOUNTER — Other Ambulatory Visit: Payer: Self-pay

## 2022-07-13 DIAGNOSIS — F1290 Cannabis use, unspecified, uncomplicated: Secondary | ICD-10-CM | POA: Insufficient documentation

## 2022-07-13 DIAGNOSIS — I10 Essential (primary) hypertension: Secondary | ICD-10-CM | POA: Diagnosis not present

## 2022-07-13 DIAGNOSIS — R7303 Prediabetes: Secondary | ICD-10-CM | POA: Insufficient documentation

## 2022-07-13 DIAGNOSIS — D126 Benign neoplasm of colon, unspecified: Secondary | ICD-10-CM | POA: Diagnosis not present

## 2022-07-13 DIAGNOSIS — Z1211 Encounter for screening for malignant neoplasm of colon: Secondary | ICD-10-CM | POA: Insufficient documentation

## 2022-07-13 DIAGNOSIS — D122 Benign neoplasm of ascending colon: Secondary | ICD-10-CM | POA: Diagnosis not present

## 2022-07-13 HISTORY — DX: Essential (primary) hypertension: I10

## 2022-07-13 HISTORY — DX: Prediabetes: R73.03

## 2022-07-13 HISTORY — PX: COLONOSCOPY WITH PROPOFOL: SHX5780

## 2022-07-13 LAB — POCT PREGNANCY, URINE: Preg Test, Ur: NEGATIVE

## 2022-07-13 SURGERY — COLONOSCOPY WITH PROPOFOL
Anesthesia: General

## 2022-07-13 MED ORDER — PROPOFOL 10 MG/ML IV BOLUS
INTRAVENOUS | Status: DC | PRN
  Administered 2022-07-13: 100 mg via INTRAVENOUS

## 2022-07-13 MED ORDER — DEXMEDETOMIDINE HCL IN NACL 200 MCG/50ML IV SOLN
INTRAVENOUS | Status: DC | PRN
  Administered 2022-07-13: 12 ug via INTRAVENOUS

## 2022-07-13 MED ORDER — PROPOFOL 500 MG/50ML IV EMUL
INTRAVENOUS | Status: DC | PRN
  Administered 2022-07-13: 150 ug/kg/min via INTRAVENOUS

## 2022-07-13 MED ORDER — SODIUM CHLORIDE 0.9 % IV SOLN: INTRAVENOUS | Status: DC

## 2022-07-13 MED ORDER — LIDOCAINE HCL (CARDIAC) PF 100 MG/5ML IV SOSY
PREFILLED_SYRINGE | INTRAVENOUS | Status: DC | PRN
  Administered 2022-07-13: 50 mg via INTRAVENOUS

## 2022-07-13 NOTE — H&P (Signed)
Jonathon Bellows, MD [address] [place], [place], [place], [address] [address], [place], [place], [place], [address] Phone: [phone]  Fax: [phone]  Primary Care Physician:  Gayland Curry, MD   Pre-Procedure History & Physical: HPI:  Morgan Murphy is a 46 y.o. female is here for an colonoscopy.   Past Medical History:  Diagnosis Date   Depression    Hypertension    Pre-diabetes     Past Surgical History:  Procedure Laterality Date   GANGLION CYST EXCISION     LAPAROTOMY N/A 08/18/2016   Procedure: LAPAROTOMY;  Surgeon: Gae Dry, MD;  Location: [facility];  Service: Gynecology;  Laterality: N/A;   MYOMECTOMY N/A 08/18/2016   Procedure: MYOMECTOMY;  Surgeon: Gae Dry, MD;  Location: [facility];  Service: Gynecology;  Laterality: N/A;    Prior to Admission medications   Medication Sig Start Date End Date Taking? Authorizing Provider  Ascorbic Acid (VITAMIN C) 1000 MG tablet Take 1,000 Units by mouth daily.   Yes [provider]  Cholecalciferol 25 MCG (1000 UT) tablet Take 4,000 Units by mouth daily.   Yes [provider]  metroNIDAZOLE (FLAGYL) 500 MG tablet Take 1 tab BID for 7 days; NO alcohol use for 10 days after prescription start Patient not taking: Reported on 07/13/2022 06/23/18   Copland, Elmo Putt B, PA-C  sertraline (ZOLOFT) 25 MG tablet Take 25 mg by mouth at bedtime. Patient not taking: Reported on 07/13/2022 04/13/20   [provider]    Allergies as of 06/24/2022   (No Known Allergies)    No family history on file.  Social History   Socioeconomic History   Marital status: Divorced    Spouse name: Not on file   Number of children: Not on file   Years of education: Not on file   Highest education level: Not on file  Occupational History   Not on file  Tobacco Use   Smoking status: Never   Smokeless tobacco: Never  Vaping Use   Vaping Use: Never used  Substance and Sexual Activity   Alcohol  use: Yes    Alcohol/week: 2.0 standard drinks of alcohol    Types: 2 Shots of liquor per week   Drug use: Yes    Types: Marijuana   Sexual activity: Yes    Birth control/protection: Condom, None  Other Topics Concern   Not on file  Social History Narrative   Not on file   Social Determinants of Health   Financial Resource Strain: Not on file  Food Insecurity: Not on file  Transportation Needs: Not on file  Physical Activity: Not on file  Stress: Not on file  Social Connections: Not on file  Intimate Partner Violence: Not on file    Review of Systems: See HPI, otherwise negative ROS  Physical Exam: BP 133/80   Pulse 93   Temp (!) 96.7 F (35.9 C)   Resp 16   Ht '5\' 6"'$  (1.676 m)   Wt 117.9 kg   SpO2 100%   BMI 41.97 kg/m  General:   Alert,  pleasant and cooperative in NAD Head:  Normocephalic and atraumatic. Neck:  Supple; no masses or thyromegaly. Lungs:  Clear throughout to auscultation, normal respiratory effort.    Heart:  +S1, +S2, Regular rate and rhythm, No edema. Abdomen:  Soft, nontender and nondistended. Normal bowel sounds, without guarding, and without rebound.   Neurologic:  Alert and  oriented x4;  grossly normal neurologically.  Impression/Plan: Morgan Murphy is here for an colonoscopy to be performed for Screening colonoscopy average risk   Risks, benefits, limitations, and alternatives regarding  colonoscopy have been reviewed with the patient.  Questions have been answered.  All parties agreeable.   Jonathon Bellows, MD  07/13/2022, 8:22 AM

## 2022-07-13 NOTE — Anesthesia Postprocedure Evaluation (Signed)
Anesthesia Post Note  Patient: Morgan Murphy  Procedure(s) Performed: COLONOSCOPY WITH PROPOFOL  Patient location during evaluation: Endoscopy Anesthesia Type: General Level of consciousness: awake and alert Pain management: pain level controlled Vital Signs Assessment: post-procedure vital signs reviewed and stable Respiratory status: spontaneous breathing, nonlabored ventilation, respiratory function stable and patient connected to nasal cannula oxygen Cardiovascular status: blood pressure returned to baseline and stable Postop Assessment: no apparent nausea or vomiting Anesthetic complications: no   No notable events documented.   Last Vitals:  Vitals:   07/13/22 0853 07/13/22 0912  BP: 123/61   Pulse:    Resp:  16  Temp:    SpO2:      Last Pain:  Vitals:   07/13/22 0912  TempSrc:   PainSc: 0-No pain                 Precious Haws Damarian Priola

## 2022-07-13 NOTE — Transfer of Care (Signed)
Immediate Anesthesia Transfer of Care Note  Patient: Morgan Murphy  Procedure(s) Performed: COLONOSCOPY WITH PROPOFOL  Patient Location: PACU and Endoscopy Unit  Anesthesia Type:General  Level of Consciousness: drowsy and patient cooperative  Airway & Oxygen Therapy: Patient Spontanous Breathing  Post-op Assessment: Report given to RN and Post -op Vital signs reviewed and stable  Post vital signs: Reviewed and stable  Last Vitals:  Vitals Value Taken Time  BP    Temp    Pulse    Resp    SpO2      Last Pain:  Vitals:   07/13/22 0843  TempSrc: Temporal  PainSc: 0-No pain      Patients Stated Pain Goal: 0 (42/35/36 1443)  Complications: No notable events documented.

## 2022-07-13 NOTE — Op Note (Signed)
[facility] Gastroenterology Patient Name: Morgan Murphy Procedure Date: 07/13/2022 8:20 AM MRN: [id number] Account #: [id number] Date of Birth: December 11, 1975 Admit Type: Outpatient Age: 46 Room: [facility] ENDO ROOM 3 Gender: Female Note Status: Finalized Instrument Name: Park Meo [id number] Procedure:             Colonoscopy Indications:           Screening for colorectal malignant neoplasm Providers:             Jonathon Bellows MD, MD Referring MD:          Gayland Curry MD, MD (Referring MD) Medicines:             Monitored Anesthesia Care Complications:         No immediate complications. Procedure:             Pre-Anesthesia Assessment:                        - Prior to the procedure, a History and Physical was                         performed, and patient medications, allergies and                         sensitivities were reviewed. The patient's tolerance                         of previous anesthesia was reviewed.                        - The risks and benefits of the procedure and the                         sedation options and risks were discussed with the                         patient. All questions were answered and informed                         consent was obtained.                        - ASA Grade Assessment: II - A patient with mild                         systemic disease.                        After obtaining informed consent, the colonoscope was                         passed under direct vision. Throughout the procedure,                         the patient's blood pressure, pulse, and oxygen                         saturations were monitored continuously. The                         Colonoscope was  introduced through the anus and                         advanced to the the cecum, identified by the                         appendiceal orifice. The colonoscopy was performed                         with ease. The patient tolerated the procedure  well.                         The quality of the bowel preparation was excellent. Findings:      The perianal and digital rectal examinations were normal.      A 3 mm polyp was found in the ascending colon. The polyp was       semi-sessile. The polyp was removed with a jumbo cold forceps. Resection       and retrieval were complete.      The exam was otherwise without abnormality on direct and retroflexion       views. Impression:            - One 3 mm polyp in the ascending colon, removed with                         a jumbo cold forceps. Resected and retrieved.                        - The examination was otherwise normal on direct and                         retroflexion views. Recommendation:        - Discharge patient to home (with escort).                        - Resume previous diet.                        - Continue present medications.                        - Await pathology results.                        - Repeat colonoscopy for surveillance based on                         pathology results. Procedure Code(s):     --- Professional ---                        [phone], Colonoscopy, flexible; with biopsy, single or                         multiple Diagnosis Code(s):     --- Professional ---                        Z12.11, Encounter for screening for malignant neoplasm  of colon                        K63.5, Polyp of colon CPT copyright 2019 American Medical Association. All rights reserved. The codes documented in this report are preliminary and upon coder review may  be revised to meet current compliance requirements. Jonathon Bellows, MD Jonathon Bellows MD, MD 07/13/2022 8:41:44 AM This report has been signed electronically. Number of Addenda: 0 Note Initiated On: 07/13/2022 8:20 AM Scope Withdrawal Time: 0 hours 6 minutes 40 seconds  Total Procedure Duration: 0 hours 9 minutes 22 seconds  Estimated Blood Loss:  Estimated blood loss: none.      [facility]

## 2022-07-13 NOTE — Anesthesia Preprocedure Evaluation (Signed)
Anesthesia Evaluation  Patient identified by MRN, date of birth, ID band Patient awake    Reviewed: Allergy & Precautions, NPO status , Patient's Chart, lab work & pertinent test results  Airway Mallampati: III  TM Distance: >3 FB Neck ROM: full    Dental  (+) Chipped   Pulmonary neg pulmonary ROS, neg shortness of breath,    Pulmonary exam normal        Cardiovascular Exercise Tolerance: Good hypertension, (-) anginaNormal cardiovascular exam     Neuro/Psych PSYCHIATRIC DISORDERS negative neurological ROS     GI/Hepatic negative GI ROS, Neg liver ROS,   Endo/Other  negative endocrine ROS  Renal/GU negative Renal ROS  negative genitourinary   Musculoskeletal   Abdominal   Peds  Hematology negative hematology ROS (+)   Anesthesia Other Findings Past Medical History: No date: Depression No date: Hypertension No date: Pre-diabetes  Past Surgical History: No date: GANGLION CYST EXCISION 08/18/2016: LAPAROTOMY; N/A     Comment:  Procedure: LAPAROTOMY;  Surgeon: Gae Dry, MD;                Location: [facility] ORS;  Service: Gynecology;  Laterality:               N/A; 08/18/2016: MYOMECTOMY; N/A     Comment:  Procedure: MYOMECTOMY;  Surgeon: Gae Dry, MD;                Location: [facility] ORS;  Service: Gynecology;  Laterality:               N/A;  BMI    Body Mass Index: 41.97 kg/m      Reproductive/Obstetrics negative OB ROS                             Anesthesia Physical Anesthesia Plan  ASA: 3  Anesthesia Plan: General   Post-op Pain Management:    Induction: Intravenous  PONV Risk Score and Plan: Propofol infusion and TIVA  Airway Management Planned: Natural Airway and Nasal Cannula  Additional Equipment:   Intra-op Plan:   Post-operative Plan:   Informed Consent: I have reviewed the patients History and Physical, chart, labs and discussed the procedure  including the risks, benefits and alternatives for the proposed anesthesia with the patient or authorized representative who has indicated his/her understanding and acceptance.     Dental Advisory Given  Plan Discussed with: Anesthesiologist, CRNA and Surgeon  Anesthesia Plan Comments: (Patient consented for risks of anesthesia including but not limited to:  - adverse reactions to medications - risk of airway placement if required - damage to eyes, teeth, lips or other oral mucosa - nerve damage due to positioning  - sore throat or hoarseness - Damage to heart, brain, nerves, lungs, other parts of body or loss of life  Patient voiced understanding.)        Anesthesia Quick Evaluation

## 2022-07-14 ENCOUNTER — Encounter: Payer: Self-pay | Admitting: Gastroenterology

## 2022-07-14 LAB — SURGICAL PATHOLOGY

## 2022-07-26 ENCOUNTER — Other Ambulatory Visit (HOSPITAL_COMMUNITY): Payer: Self-pay | Admitting: Family Medicine

## 2022-07-26 DIAGNOSIS — Z1231 Encounter for screening mammogram for malignant neoplasm of breast: Secondary | ICD-10-CM

## 2022-08-15 ENCOUNTER — Other Ambulatory Visit: Payer: Self-pay | Admitting: Family Medicine

## 2022-08-15 DIAGNOSIS — Z1231 Encounter for screening mammogram for malignant neoplasm of breast: Secondary | ICD-10-CM

## 2022-08-17 ENCOUNTER — Ambulatory Visit (HOSPITAL_COMMUNITY)
Admission: RE | Admit: 2022-08-17 | Discharge: 2022-08-17 | Disposition: A | Discharge status: Home / Self Care | Payer: BC Managed Care – PPO | Source: Ambulatory Visit | Attending: Family Medicine | Admitting: Family Medicine

## 2022-08-17 DIAGNOSIS — Z1231 Encounter for screening mammogram for malignant neoplasm of breast: Secondary | ICD-10-CM | POA: Diagnosis present

## 2023-07-07 ENCOUNTER — Other Ambulatory Visit: Payer: Self-pay | Admitting: Family Medicine

## 2023-07-07 DIAGNOSIS — Z1231 Encounter for screening mammogram for malignant neoplasm of breast: Secondary | ICD-10-CM

## 2023-09-14 ENCOUNTER — Ambulatory Visit
Admission: RE | Admit: 2023-09-14 | Discharge: 2023-09-14 | Disposition: A | Discharge status: Home / Self Care | Payer: BC Managed Care – PPO | Source: Ambulatory Visit | Attending: Family Medicine | Admitting: Family Medicine

## 2023-09-14 DIAGNOSIS — Z1231 Encounter for screening mammogram for malignant neoplasm of breast: Secondary | ICD-10-CM | POA: Insufficient documentation

## 2023-09-28 NOTE — Progress Notes (Deleted)
PCP: Leim Fabry, MD   No chief complaint on file.   HPI:      [name] is a 47 y.o. [id number] whose LMP was Patient's last menstrual period was 09/03/2023., presents today for her annual examination.  Her menses are regular every 28-30 days, lasting 4-5 days.  Dysmenorrhea mild, occurring first 1-2 days of flow. She does not have intermenstrual bleeding.  Sex activity: single partner, contraception - condoms mostly, declines other BC. She does have vaginal dryness occas, improved with lubricants.  Pt has noticed an increased vag d/c, fishy odor, mild irritation for the past 2 days. No meds to treat. Hx of chronic BV, no sx for past 6 months. Has tried boric acid supp with sx relief.   Last Pap: 04/13/20 Results were: ASCUS with POSITIVE high risk HPV ; CIN 1 on colpo with Dr. Bonney Aid 8/21. Repeat pap due today.  Hx of STDs: HPV  Last mammogram: 08/04/21  Results were: normal--routine follow-up in 12 months There is no FH of breast cancer. There is no FH of ovarian cancer. The patient does not do self-breast exams.  Colonoscopy:  a few yrs ago due to GI issues; has GI ref placed, is waiting to be sheds.   Tobacco use: The patient denies current or previous tobacco use. Alcohol use: social drinker No drug use Exercise: min active  She does get adequate calcium and Vitamin D in her diet.  Labs with PCP.   Patient Active Problem List   Diagnosis Date Noted   Encounter for screening colonoscopy    Adenomatous polyp of colon    Post-op pain 08/19/2016   Fibroids 08/18/2016   Menorrhagia 08/18/2016    Past Surgical History:  Procedure Laterality Date   COLONOSCOPY WITH PROPOFOL N/A 07/13/2022   Procedure: COLONOSCOPY WITH PROPOFOL;  Surgeon: Wyline Mood, MD;  Location: [facility] ENDOSCOPY;  Service: Gastroenterology;  Laterality: N/A;   GANGLION CYST EXCISION     LAPAROTOMY N/A 08/18/2016   Procedure: LAPAROTOMY;  Surgeon: Nadara Mustard, MD;  Location: [facility] ORS;   Service: Gynecology;  Laterality: N/A;   MYOMECTOMY N/A 08/18/2016   Procedure: MYOMECTOMY;  Surgeon: Nadara Mustard, MD;  Location: [facility] ORS;  Service: Gynecology;  Laterality: N/A;    No family history on file.  Social History   Socioeconomic History   Marital status: Divorced    Spouse name: Not on file   Number of children: Not on file   Years of education: Not on file   Highest education level: Not on file  Occupational History   Not on file  Tobacco Use   Smoking status: Never   Smokeless tobacco: Never  Vaping Use   Vaping status: Never Used  Substance and Sexual Activity   Alcohol use: Yes    Alcohol/week: 2.0 standard drinks of alcohol    Types: 2 Shots of liquor per week   Drug use: Yes    Types: Marijuana   Sexual activity: Yes    Birth control/protection: Condom, None  Other Topics Concern   Not on file  Social History Narrative   Not on file   Social Drivers of Health   Financial Resource Strain: Low Risk  (06/26/2023)   Received from [facility]   Overall Financial Resource Strain (CARDIA)    Difficulty of Paying Living Expenses: Not very hard  Food Insecurity: No Food Insecurity (06/26/2023)   Received from [facility]   Hunger Vital Sign  Worried About Programme researcher, broadcasting/film/video in the Last Year: Never true    Ran Out of Food in the Last Year: Never true  Transportation Needs: No Transportation Needs (06/26/2023)   Received from [facility] - Transportation    In the past 12 months, has lack of transportation kept you from medical appointments or from getting medications?: No    Lack of Transportation (Non-Medical): No  Physical Activity: Not on file  Stress: Not on file  Social Connections: Not on file  Intimate Partner Violence: Not on file     Current Outpatient Medications:    Ascorbic Acid (VITAMIN C) 1000 MG tablet, Take 1,000 Units by mouth daily., Disp: , Rfl:     Cholecalciferol 25 MCG (1000 UT) tablet, Take 4,000 Units by mouth daily., Disp: , Rfl:    metroNIDAZOLE (FLAGYL) 500 MG tablet, Take 1 tab BID for 7 days; NO alcohol use for 10 days after prescription start (Patient not taking: Reported on 07/13/2022), Disp: 14 tablet, Rfl: 0   sertraline (ZOLOFT) 25 MG tablet, Take 25 mg by mouth at bedtime. (Patient not taking: Reported on 07/13/2022), Disp: , Rfl:      ROS:  Review of Systems  Constitutional:  Negative for fatigue, fever and unexpected weight change.  Respiratory:  Negative for cough, shortness of breath and wheezing.   Cardiovascular:  Negative for chest pain, palpitations and leg swelling.  Gastrointestinal:  Negative for blood in stool, constipation, diarrhea, nausea and vomiting.  Endocrine: Negative for cold intolerance, heat intolerance and polyuria.  Genitourinary:  Positive for vaginal discharge. Negative for dyspareunia, dysuria, flank pain, frequency, genital sores, hematuria, menstrual problem, pelvic pain, urgency, vaginal bleeding and vaginal pain.  Musculoskeletal:  Positive for arthralgias. Negative for back pain, joint swelling and myalgias.  Skin:  Negative for rash.  Neurological:  Negative for dizziness, syncope, light-headedness, numbness and headaches.  Hematological:  Negative for adenopathy.  Psychiatric/Behavioral:  Negative for agitation, confusion, sleep disturbance and suicidal ideas. The patient is not nervous/anxious.    BREAST: No symptoms    Objective: LMP 09/03/2023    Physical Exam Constitutional:      Appearance: She is well-developed.  Genitourinary:     Vulva normal.     Right Labia: No rash, tenderness or lesions.    Left Labia: No tenderness, lesions or rash.    No vaginal discharge, erythema or tenderness.      Right Adnexa: not tender and no mass present.    Left Adnexa: not tender and no mass present.    No cervical friability or polyp.     Uterus is not enlarged or tender.   Breasts:    Right: No mass, nipple discharge, skin change or tenderness.     Left: No mass, nipple discharge, skin change or tenderness.  Neck:     Thyroid: No thyromegaly.  Cardiovascular:     Rate and Rhythm: Normal rate and regular rhythm.     Heart sounds: Normal heart sounds. No murmur heard. Pulmonary:     Effort: Pulmonary effort is normal.     Breath sounds: Normal breath sounds.  Abdominal:     Palpations: Abdomen is soft.     Tenderness: There is no abdominal tenderness. There is no guarding or rebound.  Musculoskeletal:        General: Normal range of motion.     Cervical back: Normal range of motion.  Lymphadenopathy:     Cervical: No cervical adenopathy.  Neurological:     General: No focal deficit present.     Mental Status: She is alert and oriented to person, place, and time.     Cranial Nerves: No cranial nerve deficit.  Skin:    General: Skin is warm and dry.  Psychiatric:        Mood and Affect: Mood normal.        Behavior: Behavior normal.        Thought Content: Thought content normal.        Judgment: Judgment normal.  Vitals reviewed.     Results: No results found for this or any previous visit (from the past 24 hours).   Assessment/Plan:  Encounter for annual routine gynecological examination  Cervical cancer screening - Plan: Cytology - PAP  Screening for HPV (human papillomavirus) - Plan: Cytology - PAP  ASCUS with positive high risk HPV cervical - Plan: Cytology - PAP  Dysplasia of cervix, low grade (CIN 1) - Plan: Cytology - PAP; repeat pap today, will f/u with results.   Encounter for screening mammogram for malignant neoplasm of breast; pt current on mammo  Screening for colon cancer--pt getting sheds with GI.   BV (bacterial vaginosis) - Plan: metroNIDAZOLE (FLAGYL) 500 MG tablet, fluconazole (DIFLUCAN) 150 MG tablet, POCT Wet Prep with KOH; pos sx and wet prep. Rx flagyl, no EtOH. Rx diflucan prn yeast vag sx.    No orders of  the defined types were placed in this encounter.           GYN counsel breast self exam, mammography screening, adequate intake of calcium and vitamin D, diet and exercise    F/U  No follow-ups on file.  Lauri Purdum B. Christpoher Sievers, PA-C 09/28/2023 7:48 PM

## 2023-09-29 ENCOUNTER — Ambulatory Visit: Payer: BC Managed Care – PPO | Admitting: Obstetrics and Gynecology

## 2023-10-17 NOTE — Progress Notes (Deleted)
PCP: Leim Fabry, MD   No chief complaint on file.   HPI:      [name] is a 48 y.o. [id number] whose LMP was No LMP recorded., presents today for her annual examination.  Her menses are regular every 28-30 days, lasting 4-5 days.  Dysmenorrhea mild, occurring first 1-2 days of flow. She does not have intermenstrual bleeding.  Sex activity: single partner, contraception - condoms mostly, declines other BC. She does have vaginal dryness occas, improved with lubricants.  Pt has noticed an increased vag d/c, fishy odor, mild irritation for the past 2 days. No meds to treat. Hx of chronic BV, no sx for past 6 months. Has tried boric acid supp with sx relief.   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 Bonney Aid 8/21.   Hx of STDs: HPV  Last mammogram: 09/14/23  Results were: normal--routine follow-up in 12 months There is no FH of breast cancer. There is no FH of ovarian cancer. The patient does not do self-breast exams.  Colonoscopy:  10/23 at [facility] with polyps; repeat after 5 yrs.    Tobacco use: The patient denies current or previous tobacco use. Alcohol use: social drinker No drug use Exercise: min active  She does get adequate calcium and Vitamin D in her diet.  Labs with PCP.   Patient Active Problem List   Diagnosis Date Noted   Encounter for screening colonoscopy    Adenomatous polyp of colon    Post-op pain 08/19/2016   Fibroids 08/18/2016   Menorrhagia 08/18/2016    Past Surgical History:  Procedure Laterality Date   COLONOSCOPY WITH PROPOFOL N/A 07/13/2022   Procedure: COLONOSCOPY WITH PROPOFOL;  Surgeon: Wyline Mood, MD;  Location: [facility] ENDOSCOPY;  Service: Gastroenterology;  Laterality: N/A;   GANGLION CYST EXCISION     LAPAROTOMY N/A 08/18/2016   Procedure: LAPAROTOMY;  Surgeon: Nadara Mustard, MD;  Location: [facility] ORS;  Service: Gynecology;   Laterality: N/A;   MYOMECTOMY N/A 08/18/2016   Procedure: MYOMECTOMY;  Surgeon: Nadara Mustard, MD;  Location: [facility] ORS;  Service: Gynecology;  Laterality: N/A;    No family history on file.  Social History   Socioeconomic History   Marital status: Divorced    Spouse name: Not on file   Number of children: Not on file   Years of education: Not on file   Highest education level: Not on file  Occupational History   Not on file  Tobacco Use   Smoking status: Never   Smokeless tobacco: Never  Vaping Use   Vaping status: Never Used  Substance and Sexual Activity   Alcohol use: Yes    Alcohol/week: 2.0 standard drinks of alcohol    Types: 2 Shots of liquor per week   Drug use: Yes    Types: Marijuana   Sexual activity: Yes    Birth control/protection: Condom, None  Other Topics Concern   Not on file  Social History Narrative   Not on file   Social Drivers of Health   Financial Resource Strain: Low Risk  (06/26/2023)   Received from [facility]   Overall Financial Resource Strain (CARDIA)    Difficulty of Paying Living Expenses: Not very hard  Food Insecurity: No Food Insecurity (06/26/2023)   Received from [facility]   Hunger Vital Sign    Worried About Running Out of  Food in the Last Year: Never true    Ran Out of Food in the Last Year: Never true  Transportation Needs: No Transportation Needs (06/26/2023)   Received from [facility] - Transportation    In the past 12 months, has lack of transportation kept you from medical appointments or from getting medications?: No    Lack of Transportation (Non-Medical): No  Physical Activity: Not on file  Stress: Not on file  Social Connections: Not on file  Intimate Partner Violence: Not on file     Current Outpatient Medications:    Ascorbic Acid (VITAMIN C) 1000 MG tablet, Take 1,000 Units by mouth daily., Disp: , Rfl:    Cholecalciferol 25 MCG (1000 UT)  tablet, Take 4,000 Units by mouth daily., Disp: , Rfl:    metroNIDAZOLE (FLAGYL) 500 MG tablet, Take 1 tab BID for 7 days; NO alcohol use for 10 days after prescription start (Patient not taking: Reported on 07/13/2022), Disp: 14 tablet, Rfl: 0   sertraline (ZOLOFT) 25 MG tablet, Take 25 mg by mouth at bedtime. (Patient not taking: Reported on 07/13/2022), Disp: , Rfl:      ROS:  Review of Systems  Constitutional:  Negative for fatigue, fever and unexpected weight change.  Respiratory:  Negative for cough, shortness of breath and wheezing.   Cardiovascular:  Negative for chest pain, palpitations and leg swelling.  Gastrointestinal:  Negative for blood in stool, constipation, diarrhea, nausea and vomiting.  Endocrine: Negative for cold intolerance, heat intolerance and polyuria.  Genitourinary:  Positive for vaginal discharge. Negative for dyspareunia, dysuria, flank pain, frequency, genital sores, hematuria, menstrual problem, pelvic pain, urgency, vaginal bleeding and vaginal pain.  Musculoskeletal:  Positive for arthralgias. Negative for back pain, joint swelling and myalgias.  Skin:  Negative for rash.  Neurological:  Negative for dizziness, syncope, light-headedness, numbness and headaches.  Hematological:  Negative for adenopathy.  Psychiatric/Behavioral:  Negative for agitation, confusion, sleep disturbance and suicidal ideas. The patient is not nervous/anxious.    BREAST: No symptoms    Objective: There were no vitals taken for this visit.   Physical Exam Constitutional:      Appearance: She is well-developed.  Genitourinary:     Vulva normal.     Right Labia: No rash, tenderness or lesions.    Left Labia: No tenderness, lesions or rash.    No vaginal discharge, erythema or tenderness.      Right Adnexa: not tender and no mass present.    Left Adnexa: not tender and no mass present.    No cervical friability or polyp.     Uterus is not enlarged or tender.  Breasts:     Right: No mass, nipple discharge, skin change or tenderness.     Left: No mass, nipple discharge, skin change or tenderness.  Neck:     Thyroid: No thyromegaly.  Cardiovascular:     Rate and Rhythm: Normal rate and regular rhythm.     Heart sounds: Normal heart sounds. No murmur heard. Pulmonary:     Effort: Pulmonary effort is normal.     Breath sounds: Normal breath sounds.  Abdominal:     Palpations: Abdomen is soft.     Tenderness: There is no abdominal tenderness. There is no guarding or rebound.  Musculoskeletal:        General: Normal range of motion.     Cervical back: Normal range of motion.  Lymphadenopathy:     Cervical: No cervical adenopathy.  Neurological:     General: No focal deficit present.     Mental Status: She is alert and oriented to person, place, and time.     Cranial Nerves: No cranial nerve deficit.  Skin:    General: Skin is warm and dry.  Psychiatric:        Mood and Affect: Mood normal.        Behavior: Behavior normal.        Thought Content: Thought content normal.        Judgment: Judgment normal.  Vitals reviewed.     Results: No results found for this or any previous visit (from the past 24 hours).   Assessment/Plan:  Encounter for annual routine gynecological examination  Cervical cancer screening - Plan: Cytology - PAP  Screening for HPV (human papillomavirus) - Plan: Cytology - PAP  ASCUS with positive high risk HPV cervical - Plan: Cytology - PAP  Dysplasia of cervix, low grade (CIN 1) - Plan: Cytology - PAP; repeat pap today, will f/u with results.   Encounter for screening mammogram for malignant neoplasm of breast; pt current on mammo  Screening for colon cancer--pt getting sheds with GI.   BV (bacterial vaginosis) - Plan: metroNIDAZOLE (FLAGYL) 500 MG tablet, fluconazole (DIFLUCAN) 150 MG tablet, POCT Wet Prep with KOH; pos sx and wet prep. Rx flagyl, no EtOH. Rx diflucan prn yeast vag sx.    No orders of the defined  types were placed in this encounter.           GYN counsel breast self exam, mammography screening, adequate intake of calcium and vitamin D, diet and exercise    F/U  No follow-ups on file.  Conan Mcmanaway B. Brittany Osier, PA-C 10/17/2023 4:49 PM

## 2023-10-19 ENCOUNTER — Ambulatory Visit
Admission: EM | Admit: 2023-10-19 | Discharge: 2023-10-19 | Disposition: A | Discharge status: Home / Self Care | Payer: 59 | Attending: Family Medicine | Admitting: Family Medicine

## 2023-10-19 ENCOUNTER — Other Ambulatory Visit: Payer: Self-pay

## 2023-10-19 ENCOUNTER — Encounter: Payer: Self-pay | Admitting: Emergency Medicine

## 2023-10-19 ENCOUNTER — Ambulatory Visit: Payer: 59 | Admitting: Obstetrics and Gynecology

## 2023-10-19 ENCOUNTER — Telehealth: Payer: Self-pay

## 2023-10-19 DIAGNOSIS — Z124 Encounter for screening for malignant neoplasm of cervix: Secondary | ICD-10-CM

## 2023-10-19 DIAGNOSIS — R1032 Left lower quadrant pain: Secondary | ICD-10-CM | POA: Diagnosis present

## 2023-10-19 DIAGNOSIS — N898 Other specified noninflammatory disorders of vagina: Secondary | ICD-10-CM | POA: Insufficient documentation

## 2023-10-19 DIAGNOSIS — Z01419 Encounter for gynecological examination (general) (routine) without abnormal findings: Secondary | ICD-10-CM

## 2023-10-19 DIAGNOSIS — Z1231 Encounter for screening mammogram for malignant neoplasm of breast: Secondary | ICD-10-CM

## 2023-10-19 DIAGNOSIS — N870 Mild cervical dysplasia: Secondary | ICD-10-CM

## 2023-10-19 DIAGNOSIS — Z1151 Encounter for screening for human papillomavirus (HPV): Secondary | ICD-10-CM

## 2023-10-19 HISTORY — DX: Calcaneal spur, unspecified foot: M77.30

## 2023-10-19 LAB — POCT URINALYSIS DIP (MANUAL ENTRY)
Bilirubin, UA: NEGATIVE
Blood, UA: NEGATIVE
Glucose, UA: NEGATIVE mg/dL
Nitrite, UA: NEGATIVE
Spec Grav, UA: 1.015 (ref 1.010–1.025)
Urobilinogen, UA: 1 U/dL
pH, UA: 6.5 (ref 5.0–8.0)

## 2023-10-19 NOTE — ED Triage Notes (Addendum)
Pt reports lower abdominal pain and back pain since Saturday. Pt reports pain has increased. Reports fever x2 days.

## 2023-10-19 NOTE — Telephone Encounter (Signed)
Patient called she has an annual exam scheduled with you today and she has a fever with back and pelvic pain. She wants to know if it is ok for her to come in with fever. I advised her that she may have a UTI. She denies dysuria, urinary frequency or urgency, no cough, sore throat. She also notes loss of appetite. Please advise.

## 2023-10-19 NOTE — Telephone Encounter (Signed)
No, don't come in with fever. Just has annual scheduled, can R/S till another time. Pt to take ibup/tylenol to see if back and pelvic pain improve. Could be aches from fever.

## 2023-10-19 NOTE — ED Provider Notes (Signed)
[facility]    CSN: [id number] Arrival date & time: 10/19/23  1208      History   Chief Complaint Chief Complaint  Patient presents with   Abdominal Pain    HPI Morgan Murphy is a 48 y.o. female.   Patient presenting today with 5-day history of significant constant left lower quadrant pain sometimes radiating to the flank and now 2 days of fevers additionally.  States the pain was a 9 or 10 out of 10 yesterday but now is 7 out of 10 today.  Denies association of worsening with movement or eating, and no associated symptoms to include fever, chills, body aches, vomiting, diarrhea, constipation.  States bowel movements have been unchanged.  Does now today noticed some vaginal odor that she thinks is BV but feels this is unrelated and denies any discharge, dysuria, hematuria, frequency or urgency.  So far trying over-the-counter pain relievers with very minimal relief.  No known history of chronic GI issues.  Had a colonoscopy in 2023, had some polyps removed that were benign but otherwise no abnormal findings.    Past Medical History:  Diagnosis Date   Depression    Heel spur, unspecified laterality    Hypertension    Pre-diabetes     Patient Active Problem List   Diagnosis Date Noted   Encounter for screening colonoscopy    Adenomatous polyp of colon    Post-op pain 08/19/2016   Fibroids 08/18/2016   Menorrhagia 08/18/2016    Past Surgical History:  Procedure Laterality Date   COLONOSCOPY WITH PROPOFOL N/A 07/13/2022   Procedure: COLONOSCOPY WITH PROPOFOL;  Surgeon: Wyline Mood, MD;  Location: [facility] ENDOSCOPY;  Service: Gastroenterology;  Laterality: N/A;   GANGLION CYST EXCISION     LAPAROTOMY N/A 08/18/2016   Procedure: LAPAROTOMY;  Surgeon: Nadara Mustard, MD;  Location: [facility] ORS;  Service: Gynecology;  Laterality: N/A;   MYOMECTOMY N/A 08/18/2016   Procedure: MYOMECTOMY;  Surgeon: Nadara Mustard, MD;  Location: [facility] ORS;  Service: Gynecology;   Laterality: N/A;    OB History     Gravida  3   Para  1   Term      Preterm      AB  1   Living  1      SAB  1   IAB      Ectopic      Multiple      Live Births               Home Medications    Prior to Admission medications   Medication Sig Start Date End Date Taking? Authorizing Provider  magnesium oxide (MAG-OX) 400 (240 Mg) MG tablet Take 500 mg by mouth daily.   Yes [provider]  Semaglutide-Weight Management 1.7 MG/0.75ML SOAJ Inject 1.7 mg into the skin.   Yes [provider]  Ascorbic Acid (VITAMIN C) 1000 MG tablet Take 1,000 Units by mouth daily.    [provider]  Cholecalciferol 25 MCG (1000 UT) tablet Take 4,000 Units by mouth daily.    [provider]  metroNIDAZOLE (FLAGYL) 500 MG tablet Take 1 tab BID for 7 days; NO alcohol use for 10 days after prescription start Patient not taking: Reported on 07/13/2022 10/21/21   Copland, Helmut Muster B, PA-C  sertraline (ZOLOFT) 25 MG tablet Take 25 mg by mouth at bedtime. 04/13/20   [provider]    Family History History reviewed. No pertinent family history.  Social History  Social History   Tobacco Use   Smoking status: Never   Smokeless tobacco: Never  Vaping Use   Vaping status: Never Used  Substance Use Topics   Alcohol use: Yes    Alcohol/week: 2.0 standard drinks of alcohol    Types: 2 Shots of liquor per week   Drug use: Yes    Types: Marijuana     Allergies   Celebrex [celecoxib]   Review of Systems Review of Systems Per HPI  Physical Exam Triage Vital Signs ED Triage Vitals  Encounter Vitals Group     BP 10/19/23 1224 123/84     Systolic BP Percentile --      Diastolic BP Percentile --      Pulse Rate 10/19/23 1224 (!) 116     Resp 10/19/23 1224 20     Temp 10/19/23 1224 99.3 F (37.4 C)     Temp Source 10/19/23 1224 Oral     SpO2 10/19/23 1224 98 %     Weight --      Height --      Head Circumference --      Peak  Flow --      Pain Score 10/19/23 1219 7     Pain Loc --      Pain Education --      Exclude from Growth Chart --    No data found.  Updated Vital Signs BP 123/84 (BP Location: Right Arm)   Pulse (!) 116   Temp 99.3 F (37.4 C) (Oral)   Resp 20   LMP 09/20/2023 (Approximate)   SpO2 98%   Visual Acuity Right Eye Distance:   Left Eye Distance:   Bilateral Distance:    Right Eye Near:   Left Eye Near:    Bilateral Near:     Physical Exam Vitals and nursing note reviewed.  Constitutional:      Appearance: Normal appearance. She is not ill-appearing.  HENT:     Head: Atraumatic.     Mouth/Throat:     Mouth: Mucous membranes are moist.  Eyes:     Extraocular Movements: Extraocular movements intact.     Conjunctiva/sclera: Conjunctivae normal.  Cardiovascular:     Rate and Rhythm: Normal rate and regular rhythm.     Heart sounds: Normal heart sounds.  Pulmonary:     Effort: Pulmonary effort is normal.     Breath sounds: Normal breath sounds.  Abdominal:     General: Bowel sounds are normal. There is no distension.     Palpations: Abdomen is soft. There is no mass.     Tenderness: There is abdominal tenderness. There is no right CVA tenderness, left CVA tenderness, guarding or rebound.  Genitourinary:    Comments: GU exam declined.  Self swab performed Musculoskeletal:        General: Normal range of motion.     Cervical back: Normal range of motion and neck supple.  Skin:    General: Skin is warm and dry.  Neurological:     Mental Status: She is alert and oriented to person, place, and time.  Psychiatric:        Mood and Affect: Mood normal.        Thought Content: Thought content normal.        Judgment: Judgment normal.      UC Treatments / Results  Labs (all labs ordered are listed, but only abnormal results are displayed) Labs Reviewed  POCT URINALYSIS DIP (MANUAL ENTRY) - Abnormal; Notable  for the following components:      Result Value   Ketones,  POC UA small (15) (*)    Protein Ur, POC trace (*)    Leukocytes, UA Trace (*)    All other components within normal limits  COMPREHENSIVE METABOLIC PANEL  CBC WITH DIFFERENTIAL/PLATELET  CERVICOVAGINAL ANCILLARY ONLY    EKG   Radiology No results found.  Procedures Procedures (including critical care time)  Medications Ordered in UC Medications - No data to display  Initial Impression / Assessment and Plan / UC Course  I have reviewed the triage vital signs and the nursing notes.  Pertinent labs & imaging results that were available during my care of the patient were reviewed by me and considered in my medical decision making (see chart for details).     Mildly tachycardic in triage, otherwise vital signs reassuring.  No red flag findings on exam but concerning duration and severity of symptoms and now with fever.  Recommended ED evaluation, patient wishing to follow-up with PCP as soon as possible unless symptoms worsen.  Labs obtained today for further evaluation and safety check.  She is also requesting a vaginal swab as she is concerned she has BV so this is pending, treat based on results.  Final Clinical Impressions(s) / UC Diagnoses   Final diagnoses:  LLQ pain  Vaginal odor     Discharge Instructions      We will be in touch with your vaginal swab and blood work results as soon as possible if anything comes back abnormal.  Continue over-the-counter pain and fever reducers, fluids and follow-up in the emergency department for worsening or unresolving symptoms.  Follow-up with PCP as soon as possible for further evaluation.    ED Prescriptions   None    PDMP not reviewed this encounter.   Particia Nearing, [place] 10/19/23 1416

## 2023-10-19 NOTE — Telephone Encounter (Signed)
Contacted the patient via phone. The patient has rescheduled for 2/11 with Alicia Copland.

## 2023-10-19 NOTE — Discharge Instructions (Signed)
We will be in touch with your vaginal swab and blood work results as soon as possible if anything comes back abnormal.  Continue over-the-counter pain and fever reducers, fluids and follow-up in the emergency department for worsening or unresolving symptoms.  Follow-up with PCP as soon as possible for further evaluation.

## 2023-10-20 ENCOUNTER — Telehealth (HOSPITAL_BASED_OUTPATIENT_CLINIC_OR_DEPARTMENT_OTHER): Payer: Self-pay

## 2023-10-20 ENCOUNTER — Other Ambulatory Visit: Payer: Self-pay

## 2023-10-20 ENCOUNTER — Emergency Department: Payer: 59

## 2023-10-20 ENCOUNTER — Emergency Department
Admission: EM | Admit: 2023-10-20 | Discharge: 2023-10-20 | Disposition: A | Discharge status: Home / Self Care | Payer: 59 | Attending: Student in an Organized Health Care Education/Training Program | Admitting: Student in an Organized Health Care Education/Training Program

## 2023-10-20 DIAGNOSIS — R1032 Left lower quadrant pain: Secondary | ICD-10-CM | POA: Insufficient documentation

## 2023-10-20 DIAGNOSIS — D259 Leiomyoma of uterus, unspecified: Secondary | ICD-10-CM

## 2023-10-20 DIAGNOSIS — R110 Nausea: Secondary | ICD-10-CM | POA: Diagnosis not present

## 2023-10-20 LAB — COMPREHENSIVE METABOLIC PANEL
ALT: 27 [IU]/L (ref 0–32)
ALT: 25 U/L (ref 0–44)
AST: 19 [IU]/L (ref 0–40)
AST: 20 U/L (ref 15–41)
Albumin: 4.2 g/dL (ref 3.9–4.9)
Albumin: 3.8 g/dL (ref 3.5–5.0)
Alkaline Phosphatase: 84 [IU]/L (ref 44–121)
Alkaline Phosphatase: 69 U/L (ref 38–126)
Anion gap: 12 (ref 5–15)
BUN/Creatinine Ratio: 8 — ABNORMAL LOW (ref 9–23)
BUN: 7 mg/dL (ref 6–24)
BUN: 11 mg/dL (ref 6–20)
Bilirubin Total: 0.6 mg/dL (ref 0.0–1.2)
CO2: 23 mmol/L (ref 20–29)
CO2: 27 mmol/L (ref 22–32)
Calcium: 8.9 mg/dL (ref 8.7–10.2)
Calcium: 8.8 mg/dL — ABNORMAL LOW (ref 8.9–10.3)
Chloride: 95 mmol/L — ABNORMAL LOW (ref 96–106)
Chloride: 97 mmol/L — ABNORMAL LOW (ref 98–111)
Creatinine, Ser: 0.88 mg/dL (ref 0.57–1.00)
Creatinine, Ser: 0.91 mg/dL (ref 0.44–1.00)
GFR, Estimated: >60 mL/min (ref >60)
Globulin, Total: 3.3 g/dL (ref 1.5–4.5)
Glucose, Bld: 90 mg/dL (ref 70–99)
Glucose: 96 mg/dL (ref 70–99)
Potassium: 3.6 mmol/L (ref 3.5–5.2)
Potassium: 3.3 mmol/L — ABNORMAL LOW (ref 3.5–5.1)
Sodium: 136 mmol/L (ref 134–144)
Sodium: 136 mmol/L (ref 135–145)
Total Bilirubin: 0.9 mg/dL (ref 0.0–1.2)
Total Protein: 7.5 g/dL (ref 6.0–8.5)
Total Protein: 8.3 g/dL — ABNORMAL HIGH (ref 6.5–8.1)
eGFR: 81 mL/min/{1.73_m2} (ref >59)

## 2023-10-20 LAB — CERVICOVAGINAL ANCILLARY ONLY
Bacterial Vaginitis (gardnerella): POSITIVE — AB
Candida Glabrata: NEGATIVE
Candida Vaginitis: NEGATIVE
Comment: NEGATIVE
Comment: NEGATIVE
Comment: NEGATIVE

## 2023-10-20 LAB — CBC WITH DIFFERENTIAL/PLATELET
Basophils Absolute: 0 10*3/uL (ref 0.0–0.2)
Basos: 0 %
EOS (ABSOLUTE): 0 10*3/uL (ref 0.0–0.4)
Eos: 0 %
Hematocrit: 39.2 % (ref 34.0–46.6)
Hemoglobin: 12.4 g/dL (ref 11.1–15.9)
Immature Grans (Abs): 0 10*3/uL (ref 0.0–0.1)
Immature Granulocytes: 0 %
Lymphocytes Absolute: 1.9 10*3/uL (ref 0.7–3.1)
Lymphs: 15 %
MCH: 28.1 pg (ref 26.6–33.0)
MCHC: 31.6 g/dL (ref 31.5–35.7)
MCV: 89 fL (ref 79–97)
Monocytes Absolute: 0.9 10*3/uL (ref 0.1–0.9)
Monocytes: 8 %
Neutrophils Absolute: 9.4 10*3/uL — ABNORMAL HIGH (ref 1.4–7.0)
Neutrophils: 77 %
Platelets: 294 10*3/uL (ref 150–450)
RBC: 4.42 x10E6/uL (ref 3.77–5.28)
RDW: 13.9 % (ref 11.7–15.4)
WBC: 12.3 10*3/uL — ABNORMAL HIGH (ref 3.4–10.8)

## 2023-10-20 LAB — CBC
HCT: 36.9 % (ref 36.0–46.0)
Hemoglobin: 12 g/dL (ref 12.0–15.0)
MCH: 28.1 pg (ref 26.0–34.0)
MCHC: 32.5 g/dL (ref 30.0–36.0)
MCV: 86.4 fL (ref 80.0–100.0)
Platelets: 306 10*3/uL (ref 150–400)
RBC: 4.27 MIL/uL (ref 3.87–5.11)
RDW: 14.8 % (ref 11.5–15.5)
WBC: 12.7 10*3/uL — ABNORMAL HIGH (ref 4.0–10.5)
nRBC: 0 % (ref 0.0–0.2)

## 2023-10-20 LAB — URINALYSIS, ROUTINE W REFLEX MICROSCOPIC
Bilirubin Urine: NEGATIVE
Glucose, UA: NEGATIVE mg/dL
Ketones, ur: NEGATIVE mg/dL
Leukocytes,Ua: NEGATIVE
Nitrite: NEGATIVE
Protein, ur: 100 mg/dL — AB
RBC / HPF: >50 RBC/hpf (ref 0–5)
Specific Gravity, Urine: 1.017 (ref 1.005–1.030)
pH: 7 (ref 5.0–8.0)

## 2023-10-20 LAB — LIPASE, BLOOD: Lipase: 28 U/L (ref 11–51)

## 2023-10-20 MED ORDER — IOHEXOL 300 MG/ML  SOLN
100.0000 mL | Freq: Once | INTRAMUSCULAR | Status: AC | PRN
  Administered 2023-10-20: 100 mL via INTRAVENOUS

## 2023-10-20 MED ORDER — ONDANSETRON 4 MG PO TBDP: 4.0000 mg | ORAL_TABLET | Freq: Three times a day (TID) | ORAL | 0 refills | Status: DC | PRN

## 2023-10-20 MED ORDER — METRONIDAZOLE 500 MG PO TABS: 500.0000 mg | ORAL_TABLET | Freq: Two times a day (BID) | ORAL | 0 refills | Status: AC

## 2023-10-20 MED ORDER — METRONIDAZOLE 500 MG PO TABS: 500.0000 mg | ORAL_TABLET | Freq: Two times a day (BID) | ORAL | 0 refills | Status: DC

## 2023-10-20 MED ORDER — MORPHINE SULFATE (PF) 4 MG/ML IV SOLN
4.0000 mg | INTRAVENOUS | Status: DC | PRN
  Administered 2023-10-20: 4 mg via INTRAVENOUS
  Filled 2023-10-20: qty 1

## 2023-10-20 MED ORDER — SODIUM CHLORIDE 0.9 % IV BOLUS
500.0000 mL | Freq: Once | INTRAVENOUS | Status: AC
  Administered 2023-10-20: 500 mL via INTRAVENOUS

## 2023-10-20 MED ORDER — OXYCODONE-ACETAMINOPHEN 5-325 MG PO TABS: 1.0000 | ORAL_TABLET | Freq: Four times a day (QID) | ORAL | 0 refills | Status: AC | PRN

## 2023-10-20 MED ORDER — ONDANSETRON HCL 4 MG/2ML IJ SOLN
4.0000 mg | Freq: Once | INTRAMUSCULAR | Status: AC
  Administered 2023-10-20: 4 mg via INTRAVENOUS
  Filled 2023-10-20: qty 2

## 2023-10-20 NOTE — ED Triage Notes (Signed)
Pt here from [facility] with abd pain x1 week increasing in pain. Pt also having back pain as well. Pt endorsing fevers as well. Pt states the pain is LLQ but does not radiate. Pt denies NVD but states she does not have much of an appetite. Pt sent here for R/O diverticulitis.

## 2023-10-20 NOTE — ED Provider Notes (Signed)
[facility] Provider Note    Event Date/Time   First MD Initiated Contact with Patient 10/20/23 1242     (approximate)   History   Abdominal Pain   HPI  Morgan Murphy is a 48 y.o. female who presents to the ER for evaluation of left lower quadrant abdominal pain.  Denies any dysuria.  Does have a history of uterine fibroids but this feels much more tender.  Has had some nausea but no vomiting.  No diarrhea.      Physical Exam   Triage Vital Signs: ED Triage Vitals  Encounter Vitals Group     BP 10/20/23 0943 130/88     Systolic BP Percentile --      Diastolic BP Percentile --      Pulse Rate 10/20/23 0943 (!) 110     Resp 10/20/23 0943 18     Temp 10/20/23 0943 97.8 F (36.6 C)     Temp Source 10/20/23 0943 Oral     SpO2 10/20/23 0943 96 %     Weight 10/20/23 0944 259 lb 14.8 oz (117.9 kg)     Height 10/20/23 0944 5\' 6"  (1.676 m)     Head Circumference --      Peak Flow --      Pain Score 10/20/23 0944 7     Pain Loc --      Pain Education --      Exclude from Growth Chart --     Most recent vital signs: Vitals:   10/20/23 0943  BP: 130/88  Pulse: (!) 110  Resp: 18  Temp: 97.8 F (36.6 C)  SpO2: 96%     Constitutional: Alert  Eyes: Conjunctivae are normal.  Head: Atraumatic. Nose: No congestion/rhinnorhea. Mouth/Throat: Mucous membranes are moist.   Neck: Painless ROM.  Cardiovascular:   Good peripheral circulation. Respiratory: Normal respiratory effort.  No retractions.  Gastrointestinal: Soft with mild tenderness palpation left lower quadrant. Musculoskeletal:  no deformity Neurologic:  MAE spontaneously. No gross focal neurologic deficits are appreciated.  Skin:  Skin is warm, dry and intact. No rash noted. Psychiatric: Mood and affect are normal. Speech and behavior are normal.    ED Results / Procedures / Treatments   Labs (all labs ordered are listed, but only abnormal results are displayed) Labs Reviewed   COMPREHENSIVE METABOLIC PANEL - Abnormal; Notable for the following components:      Result Value   Potassium 3.3 (*)    Chloride 97 (*)    Calcium 8.8 (*)    Total Protein 8.3 (*)    All other components within normal limits  CBC - Abnormal; Notable for the following components:   WBC 12.7 (*)    All other components within normal limits  URINALYSIS, ROUTINE W REFLEX MICROSCOPIC - Abnormal; Notable for the following components:   Color, Urine YELLOW (*)    APPearance HAZY (*)    Hgb urine dipstick LARGE (*)    Protein, ur 100 (*)    Bacteria, UA RARE (*)    All other components within normal limits  LIPASE, BLOOD  POC URINE PREG, ED     EKG     RADIOLOGY Please see ED Course for my review and interpretation.  I personally reviewed all radiographic images ordered to evaluate for the above acute complaints and reviewed radiology reports and findings.  These findings were personally discussed with the patient.  Please see medical record for radiology report.    PROCEDURES:  Critical Care performed:   Procedures   MEDICATIONS ORDERED IN ED: Medications  morphine (PF) 4 MG/ML injection 4 mg (4 mg Intravenous Given 10/20/23 1316)  ondansetron (ZOFRAN) injection 4 mg (4 mg Intravenous Given 10/20/23 1316)  sodium chloride 0.9 % bolus 500 mL (500 mLs Intravenous New Bag/Given 10/20/23 1317)  iohexol (OMNIPAQUE) 300 MG/ML solution 100 mL (100 mLs Intravenous Contrast Given 10/20/23 1358)     IMPRESSION / MDM / ASSESSMENT AND PLAN / ED COURSE  I reviewed the triage vital signs and the nursing notes.                              Differential diagnosis includes, but is not limited to, colitis, diverticulitis, enteritis, mass, malignancy, UTI, stone  Patient presenting to the ER for evaluation of symptoms as described above.  Based on symptoms, risk factors and considered above differential, this presenting complaint could reflect a potentially life-threatening illness  therefore the patient will be placed on continuous pulse oximetry and telemetry for monitoring.  Laboratory evaluation will be sent to evaluate for the above complaints.      Clinical Course as of 10/20/23 1522  Fri Oct 20, 2023  1414 CT imaging on my review and interpretation does not show any evidence of acute diverticulitis or perforation.  Does have heterogenous large appearing uterus consistent with history of fibroids will await formal radiology report. [PR]    Clinical Course User Index [PR] Willy Eddy, MD   Patient will be signed out oncoming physician pending ct imaging.  FINAL CLINICAL IMPRESSION(S) / ED DIAGNOSES   Final diagnoses:  Left lower quadrant abdominal pain     Rx / DC Orders   ED Discharge Orders     None        Note:  This document was prepared using Dragon voice recognition software and may include unintentional dictation errors.    Willy Eddy, MD 10/20/23 [phone]

## 2023-10-20 NOTE — Telephone Encounter (Signed)
Per protocol, pt requires tx with metronidazole. Rx sent to pharmacy on file.

## 2023-10-20 NOTE — ED Provider Notes (Signed)
Procedures  Clinical Course as of 10/20/23 1636  Fri Oct 20, 2023  1414 CT imaging on my review and interpretation does not show any evidence of acute diverticulitis or perforation.  Does have heterogenous large appearing uterus consistent with history of fibroids will await formal radiology report. [PR]    Clinical Course User Index [PR] Willy Eddy, MD    ----------------------------------------- 4:36 PM on 10/20/2023 ----------------------------------------- Pain improved. CT report confirms large fibroids, no acute findings. Labs reassuring. Stable for DC home. Has established gyn and will follow up.  Final diagnoses:  Left lower quadrant abdominal pain  Uterine leiomyoma, unspecified location        Sharman Cheek, MD 10/20/23 [phone]

## 2023-10-31 ENCOUNTER — Telehealth: Payer: Self-pay | Admitting: Obstetrics and Gynecology

## 2023-10-31 DIAGNOSIS — N920 Excessive and frequent menstruation with regular cycle: Secondary | ICD-10-CM

## 2023-10-31 DIAGNOSIS — R1032 Left lower quadrant pain: Secondary | ICD-10-CM

## 2023-10-31 DIAGNOSIS — D219 Benign neoplasm of connective and other soft tissue, unspecified: Secondary | ICD-10-CM

## 2023-10-31 NOTE — Telephone Encounter (Signed)
Pt went to ED 1/25 for LLQ pain; CT scan showed multiple leio. Pt s/p myomectomy in past, but has new leio now. Pt still with menses. Concern severe LLQ with LMP due to leio. Pt has appt with me 2/25, will get Gyn u/s now so we can discuss mgmt options at appt. Pt agrees to plan. No longer having pain.

## 2023-11-04 DIAGNOSIS — Z1371 Encounter for nonprocreative screening for genetic disease carrier status: Secondary | ICD-10-CM

## 2023-11-04 DIAGNOSIS — Z8041 Family history of malignant neoplasm of ovary: Secondary | ICD-10-CM

## 2023-11-04 HISTORY — DX: Encounter for nonprocreative screening for genetic disease carrier status: Z13.71

## 2023-11-04 HISTORY — DX: Family history of malignant neoplasm of ovary: Z80.41

## 2023-11-08 ENCOUNTER — Ambulatory Visit
Admission: RE | Admit: 2023-11-08 | Discharge: 2023-11-08 | Disposition: A | Discharge status: Home / Self Care | Payer: 59 | Source: Ambulatory Visit | Attending: Obstetrics and Gynecology | Admitting: Obstetrics and Gynecology

## 2023-11-08 DIAGNOSIS — R1032 Left lower quadrant pain: Secondary | ICD-10-CM | POA: Diagnosis present

## 2023-11-08 DIAGNOSIS — D219 Benign neoplasm of connective and other soft tissue, unspecified: Secondary | ICD-10-CM | POA: Insufficient documentation

## 2023-11-09 ENCOUNTER — Encounter: Payer: Self-pay | Admitting: Obstetrics and Gynecology

## 2023-11-13 NOTE — Progress Notes (Signed)
PCP: Leim Fabry, MD   Chief Complaint  Patient presents with   Gynecologic Exam    Discharge, fishy odor, no itching x 4 days    HPI:      Morgan Murphy is a 48 y.o. G3P0011 whose LMP was Patient's last menstrual period was 10/20/2023 (approximate)., presents today for her annual examination.  Her menses are regular every 28-30 days, lasting 5 days, mod flow with small clots, but flow increasing from myomectomy in 2017.  Dysmenorrhea mild to mod, increasing since myomectomy, slightly improved with NSAIDs. Pt doesn't want period to get bad again. Did depo in the past and did well/was amenorrheic. Taking Fe supp.   Went to ED 1/25 for LLQ pain, Gyn u/s 11/08/23 showed Enlarged uterus with multiple fibroids. The largest measures 6.9 cm. WNL CBC 1/25. Pt interested in tx since periods getting worse.  Hx of HTN in past but now just takes diuretic prn LE edema. Has not been on BP meds for a year .  Sex activity: single partner, contraception - condoms mostly. She does have vaginal dryness occas, improved with lubricants. No pain/bleeding.  Pt has had an increased vag d/c, fishy odor, mild irritation since she went to ED 1/25. Treated with flagyl then for BV but sx have persisted. Hx of chronic BV, no sx for about a year after changing to cotton tampons/using boric acid supp.    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 Bonney Aid 8/21.   Hx of STDs: HPV  Last mammogram: 09/14/23  Results were: normal--routine follow-up in 12 months There is no FH of breast cancer. There is a FH of ovarian cancer in her pat aunt, genetic testing not done. The patient does not do self-breast exams.  Colonoscopy: 10/23 at [facility] with polyps; repeat after 5 yrs.    Tobacco use: The patient denies current or previous tobacco use. Alcohol use: social drinker No drug use Exercise: min active  She does get  adequate calcium and Vitamin D in her diet.  Labs with PCP.   Patient Active Problem List   Diagnosis Date Noted   History of cervical dysplasia 11/14/2023   Family history of ovarian cancer 11/14/2023   Encounter for screening colonoscopy    Adenomatous polyp of colon    Post-op pain 08/19/2016   Fibroids 08/18/2016   Menorrhagia 08/18/2016    Past Surgical History:  Procedure Laterality Date   COLONOSCOPY WITH PROPOFOL N/A 07/13/2022   Procedure: COLONOSCOPY WITH PROPOFOL;  Surgeon: Wyline Mood, MD;  Location: [facility] ENDOSCOPY;  Service: Gastroenterology;  Laterality: N/A;   GANGLION CYST EXCISION     LAPAROTOMY N/A 08/18/2016   Procedure: LAPAROTOMY;  Surgeon: Nadara Mustard, MD;  Location: [facility] ORS;  Service: Gynecology;  Laterality: N/A;   MYOMECTOMY N/A 08/18/2016   Procedure: MYOMECTOMY;  Surgeon: Nadara Mustard, MD;  Location: [facility] ORS;  Service: Gynecology;  Laterality: N/A;    Family History  Problem Relation Age of Onset   Ovarian cancer Paternal Aunt        23s   Brain cancer Paternal Aunt     Social History   Socioeconomic History   Marital status: Divorced    Spouse name: Not on file   Number of children: Not on file   Years of education: Not on file   Highest education level: Not on file  Occupational History  Not on file  Tobacco Use   Smoking status: Never   Smokeless tobacco: Never  Vaping Use   Vaping status: Never Used  Substance and Sexual Activity   Alcohol use: Yes    Alcohol/week: 2.0 standard drinks of alcohol    Types: 2 Shots of liquor per week   Drug use: Not Currently    Types: Marijuana   Sexual activity: Yes    Birth control/protection: Condom, None  Other Topics Concern   Not on file  Social History Narrative   Not on file   Social Drivers of Health   Financial Resource Strain: Low Risk  (06/26/2023)   Received from [facility]   Overall Financial Resource Strain (CARDIA)    Difficulty of Paying  Living Expenses: Not very hard  Food Insecurity: No Food Insecurity (06/26/2023)   Received from [facility]   Hunger Vital Sign    Worried About Running Out of Food in the Last Year: Never true    Ran Out of Food in the Last Year: Never true  Transportation Needs: No Transportation Needs (06/26/2023)   Received from [facility] - Transportation    In the past 12 months, has lack of transportation kept you from medical appointments or from getting medications?: No    Lack of Transportation (Non-Medical): No  Physical Activity: Not on file  Stress: Not on file  Social Connections: Not on file  Intimate Partner Violence: Not on file     Current Outpatient Medications:    Ascorbic Acid (VITAMIN C) 1000 MG tablet, Take 1,000 Units by mouth daily., Disp: , Rfl:    chlorthalidone (HYGROTON) 25 MG tablet, Take 1 tablet by mouth daily., Disp: , Rfl:    Cholecalciferol 125 MCG (5000 UT) TABS, Take by mouth., Disp: , Rfl:    clindamycin (CLEOCIN) 300 MG capsule, Take 1 capsule (300 mg total) by mouth 2 (two) times daily for 7 days., Disp: 14 capsule, Rfl: 0   magnesium oxide (MAG-OX) 400 (240 Mg) MG tablet, Take 500 mg by mouth daily., Disp: , Rfl:    ondansetron (ZOFRAN-ODT) 4 MG disintegrating tablet, Take 1 tablet (4 mg total) by mouth every 8 (eight) hours as needed for nausea or vomiting., Disp: 20 tablet, Rfl: 0   sertraline (ZOLOFT) 25 MG tablet, Take 25 mg by mouth at bedtime., Disp: , Rfl:   Current Facility-Administered Medications:    [START ON 11/15/2023] medroxyPROGESTERone (DEPO-PROVERA) injection 150 mg, 150 mg, Intramuscular, Q90 days,      ROS:  Review of Systems  Constitutional:  Negative for fatigue, fever and unexpected weight change.  Respiratory:  Negative for cough, shortness of breath and wheezing.   Cardiovascular:  Negative for chest pain, palpitations and leg swelling.  Gastrointestinal:  Negative for blood in stool,  constipation, diarrhea, nausea and vomiting.  Endocrine: Negative for cold intolerance, heat intolerance and polyuria.  Genitourinary:  Positive for menstrual problem. Negative for dyspareunia, dysuria, flank pain, frequency, genital sores, hematuria, pelvic pain, urgency, vaginal bleeding, vaginal discharge and vaginal pain.  Musculoskeletal:  Positive for arthralgias. Negative for back pain, joint swelling and myalgias.  Skin:  Negative for rash.  Neurological:  Negative for dizziness, syncope, light-headedness, numbness and headaches.  Hematological:  Negative for adenopathy.  Psychiatric/Behavioral:  Negative for agitation, confusion, sleep disturbance and suicidal ideas. The patient is not nervous/anxious.    BREAST: No symptoms    Objective: BP 138/84   Ht 5\' 6"  (  1.676 m)   Wt 279 lb (126.6 kg)   LMP 10/20/2023 (Approximate)   BMI 45.03 kg/m    Physical Exam Constitutional:      Appearance: She is well-developed.  Genitourinary:     Vulva normal.     Right Labia: No rash, tenderness or lesions.    Left Labia: No tenderness, lesions or rash.    No vaginal discharge, erythema or tenderness.      Right Adnexa: not tender and no mass present.    Left Adnexa: not tender and no mass present.    No cervical friability or polyp.     Uterus is enlarged.     Uterus is not tender.  Breasts:    Right: No mass, nipple discharge, skin change or tenderness.     Left: No mass, nipple discharge, skin change or tenderness.  Neck:     Thyroid: No thyromegaly.  Cardiovascular:     Rate and Rhythm: Normal rate and regular rhythm.     Heart sounds: Normal heart sounds. No murmur heard. Pulmonary:     Effort: Pulmonary effort is normal.     Breath sounds: Normal breath sounds.  Abdominal:     Palpations: Abdomen is soft.     Tenderness: There is no abdominal tenderness. There is no guarding or rebound.  Musculoskeletal:        General: Normal range of motion.     Cervical back:  Normal range of motion.  Lymphadenopathy:     Cervical: No cervical adenopathy.  Neurological:     General: No focal deficit present.     Mental Status: She is alert and oriented to person, place, and time.     Cranial Nerves: No cranial nerve deficit.  Skin:    General: Skin is warm and dry.  Psychiatric:        Mood and Affect: Mood normal.        Behavior: Behavior normal.        Thought Content: Thought content normal.        Judgment: Judgment normal.  Vitals reviewed.    Assessment/Plan:  Encounter for annual routine gynecological examination  Cervical cancer screening - Plan: Cytology - PAP  Screening for HPV (human papillomavirus) - Plan: Cytology - PAP  History of cervical dysplasia - Plan: Cytology - PAP; repeat pap today. Will f/u if abn.   Encounter for screening mammogram for malignant neoplasm of breast; pt current on mammo  Family history of ovarian cancer - Plan: [facility]); MyRisk testing discussed and done today. Will f/u with results. If abn, recommend hyst for leio with BSO.   Menorrhagia with regular cycle - Plan: medroxyPROGESTERone (DEPO-PROVERA) injection 150 mg, Ambulatory referral to Vascular Surgery; pt interested in tx. Ref for UFE and start depo for sx control in meantime. Pt to RTO with menses in a few days for injection. Will then decide between UFE vs hyst after consult.   Fibroids - Plan: medroxyPROGESTERone (DEPO-PROVERA) injection 150 mg, Ambulatory referral to Vascular Surgery  Encounter for initial prescription of injectable contraceptive - Plan: medroxyPROGESTERone (DEPO-PROVERA) injection 150 mg; Rx for in office inj. Cont ca/Vit D.  BV (bacterial vaginosis) - Plan: clindamycin (CLEOCIN) 300 MG capsule; treated with flagyl after dx in ED without sx relief. Rx clindamycin. F/u prn sx.    Meds ordered this encounter  Medications   clindamycin (CLEOCIN) 300 MG capsule    Sig: Take 1 capsule (300  mg total) by mouth 2 (two)  times daily for 7 days.    Dispense:  14 capsule    Refill:  0    Supervising Provider:   Hildred Laser [[id number]]   medroxyPROGESTERone (DEPO-PROVERA) injection 150 mg           GYN counsel breast self exam, mammography screening, adequate intake of calcium and vitamin D, diet and exercise    F/U  Return in about 1 year (around 11/13/2024).  Morgan Panchal B. Alfhild Partch, PA-C 11/14/2023 5:12 PM

## 2023-11-14 ENCOUNTER — Other Ambulatory Visit (HOSPITAL_COMMUNITY)
Admission: RE | Admit: 2023-11-14 | Discharge: 2023-11-14 | Disposition: A | Discharge status: Home / Self Care | Payer: 59 | Source: Ambulatory Visit | Attending: Obstetrics and Gynecology | Admitting: Obstetrics and Gynecology

## 2023-11-14 ENCOUNTER — Encounter: Payer: Self-pay | Admitting: Obstetrics and Gynecology

## 2023-11-14 ENCOUNTER — Ambulatory Visit (INDEPENDENT_AMBULATORY_CARE_PROVIDER_SITE_OTHER): Payer: 59 | Admitting: Obstetrics and Gynecology

## 2023-11-14 VITALS — BP 138/84 | Ht 66.0 in | Wt 279.0 lb

## 2023-11-14 DIAGNOSIS — N920 Excessive and frequent menstruation with regular cycle: Secondary | ICD-10-CM

## 2023-11-14 DIAGNOSIS — D219 Benign neoplasm of connective and other soft tissue, unspecified: Secondary | ICD-10-CM

## 2023-11-14 DIAGNOSIS — Z01419 Encounter for gynecological examination (general) (routine) without abnormal findings: Secondary | ICD-10-CM | POA: Diagnosis not present

## 2023-11-14 DIAGNOSIS — Z1231 Encounter for screening mammogram for malignant neoplasm of breast: Secondary | ICD-10-CM

## 2023-11-14 DIAGNOSIS — Z8041 Family history of malignant neoplasm of ovary: Secondary | ICD-10-CM | POA: Insufficient documentation

## 2023-11-14 DIAGNOSIS — Z124 Encounter for screening for malignant neoplasm of cervix: Secondary | ICD-10-CM | POA: Diagnosis present

## 2023-11-14 DIAGNOSIS — B9689 Other specified bacterial agents as the cause of diseases classified elsewhere: Secondary | ICD-10-CM

## 2023-11-14 DIAGNOSIS — Z1151 Encounter for screening for human papillomavirus (HPV): Secondary | ICD-10-CM | POA: Insufficient documentation

## 2023-11-14 DIAGNOSIS — Z87410 Personal history of cervical dysplasia: Secondary | ICD-10-CM | POA: Insufficient documentation

## 2023-11-14 DIAGNOSIS — Z30013 Encounter for initial prescription of injectable contraceptive: Secondary | ICD-10-CM

## 2023-11-14 MED ORDER — MEDROXYPROGESTERONE ACETATE 150 MG/ML IM SUSP
150.0000 mg | INTRAMUSCULAR | Status: AC
  Administered 2024-02-12: 150 mg via INTRAMUSCULAR

## 2023-11-14 MED ORDER — CLINDAMYCIN HCL 300 MG PO CAPS: 300.0000 mg | ORAL_CAPSULE | Freq: Two times a day (BID) | ORAL | 0 refills | Status: AC

## 2023-11-14 NOTE — Patient Instructions (Signed)
I value your feedback and you entrusting [place] with your care. If you get a [place] patient survey, I would appreciate you taking the time to let us know about your experience today. Thank you! ? ? ?

## 2023-11-20 ENCOUNTER — Ambulatory Visit (INDEPENDENT_AMBULATORY_CARE_PROVIDER_SITE_OTHER): Payer: 59

## 2023-11-20 VITALS — BP 125/84 | HR 89 | Ht 66.0 in | Wt 275.0 lb

## 2023-11-20 DIAGNOSIS — Z3042 Encounter for surveillance of injectable contraceptive: Secondary | ICD-10-CM | POA: Diagnosis not present

## 2023-11-20 LAB — CYTOLOGY - PAP
Adequacy: ABSENT
Comment: NEGATIVE
Diagnosis: NEGATIVE
High risk HPV: NEGATIVE

## 2023-11-20 MED ORDER — MEDROXYPROGESTERONE ACETATE 150 MG/ML IM SUSY
150.0000 mg | PREFILLED_SYRINGE | Freq: Once | INTRAMUSCULAR | Status: AC
  Administered 2023-11-20: 150 mg via INTRAMUSCULAR

## 2023-11-20 NOTE — Progress Notes (Signed)
    NURSE VISIT NOTE  Subjective:    Patient ID: Morgan Murphy, female    DOB: 03-Mar-1976, 48 y.o.   MRN: [id number]  HPI  Patient is a 48 y.o. G74P1021 female who presents for depo provera injection.   Objective:    BP 125/84   Pulse 89   Ht 5\' 6"  (1.676 m)   Wt 275 lb (124.7 kg)   LMP 11/16/2023 (Approximate)   BMI 44.39 kg/m   Last Annual: 11/14/23. Last pap: 11/14/23. Last Depo-Provera: N/A. Side Effects if any: none. Serum HCG indicated? No . Depo-Provera 150 mg IM given by: Donnetta Hail, CMA. Site: Left Upper Outer Quandrant   Assessment:   1. Encounter for surveillance of injectable contraceptive      Plan:   Next appointment due between May 5 and May 19.    Donnetta Hail, CMA

## 2023-11-20 NOTE — Patient Instructions (Signed)

## 2023-12-11 ENCOUNTER — Encounter: Payer: Self-pay | Admitting: Obstetrics and Gynecology

## 2023-12-19 ENCOUNTER — Telehealth: Payer: Self-pay | Admitting: Obstetrics and Gynecology

## 2023-12-19 ENCOUNTER — Encounter: Payer: Self-pay | Admitting: Obstetrics and Gynecology

## 2023-12-19 NOTE — Telephone Encounter (Signed)
 Pt aware of neg MyRisk results except 5 VUS. IBIS=8.9%/riskscore=7.3%. No extra screening recommendations.  Patient understands these results only apply to her and her children, and this is not indicative of genetic testing results of her other family members. It is recommended that her other family members have genetic testing done.  Pt also understands negative genetic testing doesn't mean she will never get any of these cancers.   Hard copy mailed to pt. F/u prn.

## 2023-12-21 ENCOUNTER — Encounter: Payer: Self-pay | Admitting: Obstetrics and Gynecology

## 2024-02-12 ENCOUNTER — Ambulatory Visit (INDEPENDENT_AMBULATORY_CARE_PROVIDER_SITE_OTHER): Payer: 59

## 2024-02-12 VITALS — BP 116/80 | HR 101 | Ht 66.0 in | Wt 271.5 lb

## 2024-02-12 DIAGNOSIS — Z3042 Encounter for surveillance of injectable contraceptive: Secondary | ICD-10-CM | POA: Diagnosis not present

## 2024-02-12 NOTE — Progress Notes (Signed)
    NURSE VISIT NOTE  Subjective:    Patient ID: PARRIS LUICK, female    DOB: 01/27/1976, 48 y.o.   MRN: [id number]  HPI  Patient is a 48 y.o. G79P1021 female who presents for depo provera  injection.   Objective:    BP 116/80   Pulse (!) 101   Ht 5\' 6"  (1.676 m)   Wt 271 lb 8 oz (123.2 kg)   BMI 43.82 kg/m   Last Annual: 11/14/23. Last pap: 11/14/23. Last Depo-Provera : 11/20/23. Side Effects if any: none. Serum HCG indicated? No . Depo-Provera  150 mg IM given by: Desmond Florida, LPN. Site: Right Upper Outer Quandrant  Lab Review  @THIS  VISIT ONLY@  Assessment:   1. Encounter for surveillance of injectable contraceptive      Plan:   Next appointment due between 04/29/24 and 05/13/24.    Desmond Florida, LPN

## 2024-02-12 NOTE — Patient Instructions (Signed)

## 2024-05-06 ENCOUNTER — Ambulatory Visit

## 2024-05-06 VITALS — BP 139/87 | HR 107 | Wt 279.5 lb

## 2024-05-06 DIAGNOSIS — Z3042 Encounter for surveillance of injectable contraceptive: Secondary | ICD-10-CM | POA: Diagnosis not present

## 2024-05-06 MED ORDER — MEDROXYPROGESTERONE ACETATE 150 MG/ML IM SUSY
150.0000 mg | PREFILLED_SYRINGE | Freq: Once | INTRAMUSCULAR | Status: AC
  Administered 2024-05-06: 150 mg via INTRAMUSCULAR

## 2024-05-06 NOTE — Progress Notes (Signed)
    NURSE VISIT NOTE  Subjective:    Patient ID: Morgan Murphy, female    DOB: 23-Nov-1975, 48 y.o.   MRN: [id number]  HPI  Patient is a 48 y.o. G70P1021 female who presents for depo provera  injection.   Objective:    BP 139/87 (BP Location: Right Arm, Patient Position: Sitting, Cuff Size: Large)   Pulse (!) 107   Wt 279 lb 8 oz (126.8 kg)   BMI 45.11 kg/m   Last Annual: 11/14/23. Last pap: 11/14/23. Last Depo-Provera : 02/12/24. Side Effects if any: none. Serum HCG indicated? No . Depo-Provera  150 mg IM given by: Burnard Ro, CMA. Site: Left Upper Outer Quandrant    Assessment:   1. Encounter for surveillance of injectable contraceptive      Plan:   Next appointment due between 07/22/24 thru 08/05/24    Burnard LITTIE Ro, CMA

## 2024-07-29 ENCOUNTER — Ambulatory Visit

## 2024-07-29 VITALS — BP 120/78 | HR 101 | Ht 66.0 in | Wt 272.0 lb

## 2024-07-29 DIAGNOSIS — Z3042 Encounter for surveillance of injectable contraceptive: Secondary | ICD-10-CM

## 2024-07-29 MED ORDER — MEDROXYPROGESTERONE ACETATE 150 MG/ML IM SUSP
150.0000 mg | Freq: Once | INTRAMUSCULAR | Status: AC
  Administered 2024-07-29: 150 mg via INTRAMUSCULAR

## 2024-07-29 NOTE — Progress Notes (Signed)
    NURSE VISIT NOTE  Subjective:    Patient ID: Morgan Murphy, female    DOB: [birth year], 48 y.o.   MRN: [id number]  HPI  Patient is a 48 y.o. G51P1021 female who presents for depo provera  injection.   Objective:    BP 120/78   Pulse (!) 101   Ht 5' 6 (1.676 m)   Wt 272 lb (123.4 kg)   BMI 43.90 kg/m   Last Annual: 11/14/23. Last pap: 11/14/23. Last Depo-Provera : 05/06/24. Side Effects if any: none. Serum HCG indicated? No . Depo-Provera  150 mg IM given by: Harlene Gander, CMA. Site: Left Upper Outer Quandrant  Lab Review  No results found for any visits on 07/29/24.  Assessment:   1. Encounter for surveillance of injectable contraceptive      Plan:   Next appointment due between 10/14/24 and 10/28/24.    Harlene Gander, CMA

## 2024-10-18 NOTE — Patient Instructions (Signed)

## 2024-10-18 NOTE — Progress Notes (Signed)
" ° ° °  NURSE VISIT NOTE  Subjective:    Patient ID: Morgan Murphy, female    DOB: 1976-05-26, 49 y.o.   MRN: [id number]  HPI  Patient is a 49 y.o. G60P1021 female who presents for depo provera  injection.   Objective:    BP 113/77   Pulse 90   Ht 5' 6 (1.676 m)   Wt 276 lb 4.8 oz (125.3 kg)   BMI 44.60 kg/m   Last Annual: 11/1123. Last pap: 11/14/23. Last Depo-Provera : 07/29/24. Side Effects if any: none. Serum HCG indicated? No . Depo-Provera  150 mg IM given by: Rollo Maxin, CMA. Site: Left Upper Outer Quandrant  Lab Review  No results found for any visits on 10/21/24.  Assessment:   1. Encounter for surveillance of injectable contraceptive      Plan:   Next appointment due between April 6th and January 20, 2025.    Rollo JINNY Maxin, CMA  "

## 2024-10-21 ENCOUNTER — Ambulatory Visit

## 2024-10-21 VITALS — BP 113/77 | HR 90 | Ht 66.0 in | Wt 276.3 lb

## 2024-10-21 DIAGNOSIS — Z3042 Encounter for surveillance of injectable contraceptive: Secondary | ICD-10-CM | POA: Diagnosis not present

## 2024-10-21 MED ORDER — MEDROXYPROGESTERONE ACETATE 150 MG/ML IM SUSP
150.0000 mg | Freq: Once | INTRAMUSCULAR | Status: AC
  Administered 2024-10-21: 150 mg via INTRAMUSCULAR

## 2024-11-01 ENCOUNTER — Emergency Department (HOSPITAL_COMMUNITY)

## 2024-11-01 ENCOUNTER — Emergency Department (HOSPITAL_COMMUNITY)
Admission: EM | Admit: 2024-11-01 | Discharge: 2024-11-01 | Disposition: A | Discharge status: Home / Self Care | Attending: Emergency Medicine | Admitting: Emergency Medicine

## 2024-11-01 DIAGNOSIS — R002 Palpitations: Secondary | ICD-10-CM

## 2024-11-01 DIAGNOSIS — R0602 Shortness of breath: Secondary | ICD-10-CM

## 2024-11-01 DIAGNOSIS — R42 Dizziness and giddiness: Secondary | ICD-10-CM

## 2024-11-01 LAB — COMPREHENSIVE METABOLIC PANEL WITH GFR
ALT: 44 U/L (ref 0–44)
AST: 32 U/L (ref 15–41)
Albumin: 4.4 g/dL (ref 3.5–5.0)
Alkaline Phosphatase: 87 U/L (ref 38–126)
Anion gap: 17 — ABNORMAL HIGH (ref 5–15)
BUN: 12 mg/dL (ref 6–20)
CO2: 21 mmol/L — ABNORMAL LOW (ref 22–32)
Calcium: 9.7 mg/dL (ref 8.9–10.3)
Chloride: 101 mmol/L (ref 98–111)
Creatinine, Ser: 0.84 mg/dL (ref 0.44–1.00)
GFR, Estimated: >60 mL/min
Glucose, Bld: 106 mg/dL — ABNORMAL HIGH (ref 70–99)
Potassium: 3.6 mmol/L (ref 3.5–5.1)
Sodium: 139 mmol/L (ref 135–145)
Total Bilirubin: 0.9 mg/dL (ref 0.0–1.2)
Total Protein: 8.4 g/dL — ABNORMAL HIGH (ref 6.5–8.1)

## 2024-11-01 LAB — CBC WITH DIFFERENTIAL/PLATELET
Abs Immature Granulocytes: 0.02 10*3/uL (ref 0.00–0.07)
Basophils Absolute: 0 10*3/uL (ref 0.0–0.1)
Basophils Relative: 1 %
Eosinophils Absolute: 0.1 10*3/uL (ref 0.0–0.5)
Eosinophils Relative: 1 %
HCT: 47.2 % — ABNORMAL HIGH (ref 36.0–46.0)
Hemoglobin: 15.3 g/dL — ABNORMAL HIGH (ref 12.0–15.0)
Immature Granulocytes: 0 %
Lymphocytes Relative: 35 %
Lymphs Abs: 2.6 10*3/uL (ref 0.7–4.0)
MCH: 29.9 pg (ref 26.0–34.0)
MCHC: 32.4 g/dL (ref 30.0–36.0)
MCV: 92.2 fL (ref 80.0–100.0)
Monocytes Absolute: 0.8 10*3/uL (ref 0.1–1.0)
Monocytes Relative: 11 %
Neutro Abs: 4 10*3/uL (ref 1.7–7.7)
Neutrophils Relative %: 52 %
Platelets: 316 10*3/uL (ref 150–400)
RBC: 5.12 MIL/uL — ABNORMAL HIGH (ref 3.87–5.11)
RDW: 14.3 % (ref 11.5–15.5)
WBC: 7.6 10*3/uL (ref 4.0–10.5)
nRBC: 0 % (ref 0.0–0.2)

## 2024-11-01 LAB — PRO BRAIN NATRIURETIC PEPTIDE: Pro Brain Natriuretic Peptide: <50 pg/mL

## 2024-11-01 LAB — T4, FREE: Free T4: 1.38 ng/dL (ref 0.80–2.00)

## 2024-11-01 LAB — TROPONIN T, HIGH SENSITIVITY
Troponin T High Sensitivity: 7 ng/L (ref 0–19)
Troponin T High Sensitivity: 6 ng/L (ref 0–19)

## 2024-11-01 LAB — D-DIMER, QUANTITATIVE: D-Dimer, Quant: 0.41 ug{FEU}/mL (ref 0.00–0.50)

## 2024-11-01 LAB — TSH: TSH: 0.767 u[IU]/mL (ref 0.350–4.500)

## 2024-11-01 LAB — MAGNESIUM: Magnesium: 2.1 mg/dL (ref 1.7–2.4)

## 2024-11-01 MED ORDER — SODIUM CHLORIDE 0.9 % IV BOLUS
1000.0000 mL | Freq: Once | INTRAVENOUS | Status: AC
  Administered 2024-11-01: 1000 mL via INTRAVENOUS

## 2024-11-01 NOTE — ED Provider Notes (Signed)
 Patient received from previous provider please refer to previous note for full HPI.  In short patient presenting with intermittent shortness of breath with exertion, episodes of palpitations, lightheadedness and symptoms did not coincide with each other.  Symptoms have been present for approximately 3 weeks.  Plan will be to discharge patient with cardiology follow-up if symptoms improve after IV fluids and BNP and chest x-ray are negative. Physical Exam  BP 122/81   Pulse 91   Temp 98.4 F (36.9 C) (Oral)   Resp 18   Ht 5' 6 (1.676 m)   Wt 124.3 kg   SpO2 96%   BMI 44.22 kg/m   Physical Exam Vitals and nursing note reviewed.  Constitutional:      Appearance: Normal appearance.  HENT:     Head: Normocephalic and atraumatic.     Nose: Nose normal.  Eyes:     Extraocular Movements: Extraocular movements intact.     Conjunctiva/sclera: Conjunctivae normal.     Pupils: Pupils are equal, round, and reactive to light.  Cardiovascular:     Rate and Rhythm: Normal rate.     Heart sounds: Normal heart sounds.  Pulmonary:     Effort: Pulmonary effort is normal. No respiratory distress.     Breath sounds: Normal breath sounds.  Musculoskeletal:        General: Normal range of motion.     Cervical back: Normal range of motion.  Neurological:     General: No focal deficit present.     Mental Status: She is alert.  Psychiatric:        Mood and Affect: Mood normal.        Behavior: Behavior normal.     Procedures  Procedures  ED Course / MDM       Chest x-ray negative for any acute findings.  BNP is less than 50.  On reassessment patient is sitting comfortably reporting no new symptoms.  Fluid bolus is almost complete.  Will trial ambulation prior to discharge.  Patient was able to ambulate throughout ED without difficulty or assistance.  On reevaluation before discharge patient was comfortable with treatment plan and felt better after fluids.  Heart rate was less than 100,  lungs were clear to auscultation in all fields, patient was saturating at 100% on room air.  Patient was advised to follow-up with cardiologist and strict return precautions.  Patient agreed to treatment plan and was comfortable discharge.   Portions of this note were generated with Scientist, clinical (histocompatibility and immunogenetics). Dictation errors may occur despite best attempts at proofreading.     Myriam Fonda RAMAN, PA-C 11/01/24 2256    Elnor Jayson LABOR, DO 11/08/24 1452

## 2024-11-01 NOTE — ED Notes (Signed)
 Unable to get IV access. Will attempt US  IV upon machine availability.

## 2024-11-01 NOTE — ED Provider Notes (Signed)
 " [place] EMERGENCY DEPARTMENT AT [facility] Provider Note   CSN: [id number] Arrival date & time: 11/01/24  1225     Patient presents with: No chief complaint on file.   Morgan Murphy is a 49 y.o. female with a history including prediabetes, diagnosis of hypertension but only on as needed diuretic presenting with a 3-week history of intermittent episodes of very fleeting and transient palpitation, which has become more frequent this week.  Additionally she endorses shortness of breath with exertion, generalized fatigue and lightheadedness with positional changes, particularly standing from a seated position and also for example if she bends over to pick something off the floor she gets fairly lightheaded.  She has no chest pain, cough, pleuritic symptoms, no orthopnea.  She also denies nausea vomiting abdominal pain.  No recent URI type symptoms, cough congestion etc.  She has found no alleviators for symptoms.  She was scheduled to see her primary provider today for this complaint but her symptoms became severe so she came here instead.   The history is provided by the patient.       Prior to Admission medications  Medication Sig Start Date End Date Taking? Authorizing Provider  chlorthalidone (HYGROTON) 25 MG tablet Take 1 tablet by mouth daily. 06/26/23   [provider]  Cholecalciferol 125 MCG (5000 UT) TABS Take by mouth.    [provider]    Allergies: Metformin and Celecoxib    Review of Systems  Constitutional:  Negative for chills and fever.  HENT:  Negative for congestion.   Eyes: Negative.   Respiratory:  Positive for shortness of breath. Negative for cough, chest tightness and wheezing.   Cardiovascular:  Positive for palpitations. Negative for chest pain and leg swelling.  Gastrointestinal:  Negative for abdominal pain, nausea and vomiting.  Genitourinary: Negative.   Musculoskeletal:  Negative for arthralgias, joint swelling and neck  pain.  Skin: Negative.  Negative for rash and wound.  Neurological:  Positive for light-headedness. Negative for dizziness, weakness, numbness and headaches.  Psychiatric/Behavioral: Negative.      Updated Vital Signs BP 122/81   Pulse 91   Temp 98.4 F (36.9 C) (Oral)   Resp 18   Ht 5' 6 (1.676 m)   Wt 124.3 kg   SpO2 96%   BMI 44.22 kg/m   Physical Exam Vitals and nursing note reviewed.  Constitutional:      Appearance: She is well-developed.  HENT:     Head: Normocephalic and atraumatic.     Mouth/Throat:     Mouth: Mucous membranes are moist.  Eyes:     Conjunctiva/sclera: Conjunctivae normal.  Cardiovascular:     Rate and Rhythm: Normal rate and regular rhythm.     Heart sounds: Normal heart sounds.  Pulmonary:     Effort: Pulmonary effort is normal.     Breath sounds: Normal breath sounds. No wheezing.  Abdominal:     General: Bowel sounds are normal.     Palpations: Abdomen is soft.     Tenderness: There is no abdominal tenderness.  Musculoskeletal:        General: Normal range of motion.     Cervical back: Normal range of motion.  Skin:    General: Skin is warm and dry.  Neurological:     Mental Status: She is alert and oriented to person, place, and time.    During exam I completed orthostatics with this patient and she was tilt positive, her supine blood pressure  was 129/71 pulse 91, sitting 120/85 pulse 99 and standing 106/89 pulse 116.    (all labs ordered are listed, but only abnormal results are displayed) Labs Reviewed  CBC WITH DIFFERENTIAL/PLATELET - Abnormal; Notable for the following components:      Result Value   RBC 5.12 (*)    Hemoglobin 15.3 (*)    HCT 47.2 (*)    All other components within normal limits  COMPREHENSIVE METABOLIC PANEL WITH GFR - Abnormal; Notable for the following components:   CO2 21 (*)    Glucose, Bld 106 (*)    Total Protein 8.4 (*)    Anion gap 17 (*)    All other components within normal limits  D-DIMER,  QUANTITATIVE  TSH  MAGNESIUM  PRO BRAIN NATRIURETIC PEPTIDE  T4, FREE  TROPONIN T, HIGH SENSITIVITY  TROPONIN T, HIGH SENSITIVITY    EKG: EKG Interpretation Date/Time:  Friday November 01 2024 13:22:46 EST Ventricular Rate:  103 PR Interval:  130 QRS Duration:  68 QT Interval:  338 QTC Calculation: 442 R Axis:   40  Text Interpretation: Sinus tachycardia Otherwise normal ECG When compared with ECG of 24-Dec-2018 10:11, No significant change was found Confirmed by Elnor Savant ([id number]) on 11/01/2024 3:22:46 PM  Radiology: No results found.   Procedures   Medications Ordered in the ED  sodium chloride  0.9 % bolus 1,000 mL (1,000 mLs Intravenous New Bag/Given 11/01/24 1910)                                    Medical Decision Making Patient presenting with complaints of intermittent shortness of breath with exertion, episodes of palpitations, lightheadedness, however the symptoms  do not coincide and she has found no triggers for the symptoms although the shortness of breath is triggered by exertion such as going up a flight of steps.  Differential diagnosis would include cardiac arrhythmia, dehydration, electrolyte disturbance, she denies any excessive caffeine use, no OTC medications such as Sudafed.  Currently pending cardiac monitoring as she had not been on a cardiac monitor when roomed, she endorses having 2 episodes of palpitations since here.  Pending IV fluids as well.  I suspect patient will be dispo to home but will need outpatient cardiac referral, she will probably need an event monitor.  Patient is pending chest x-ray at this time as well.  Morgan Siva, PA-C assumes patient care at shift change.  Amount and/or Complexity of Data Reviewed Labs: ordered.    Details: Labs reviewed and are reassuring, her BNP is less than 50, she has had negative troponin, WBC count is 7.6, hemoglobin 15.3 her D-dimer is negative at 0.41 and she has a normal magnesium level at 2.1, her  potassium is 3.6 Radiology: ordered.    Details: Chest x-ray pending ECG/medicine tests: ordered.    Details: Sinus tachycardia rate 103        Final diagnoses:  Palpitation  Lightheadedness  SOB (shortness of breath)    ED Discharge Orders          Ordered    Ambulatory referral to Cardiology       Comments: If you have not heard from the Cardiology office within the next 72 hours please call [phone].   11/01/24 1958               Birdena Clarity, PA-C 11/01/24 1958  "

## 2024-11-01 NOTE — ED Notes (Signed)
 Pt ambulated around the department. Pt had no palpitations but became slightly short of breath. Pt also had an increase in heart rate to around 110. Pt oxygen saturation was 95% when placed back on the monitor. KCM~

## 2024-11-01 NOTE — Discharge Instructions (Addendum)
 Imaging, lab work, and exam are reassuring today.  Please follow-up with cardiology as soon as possible for further evaluation.  Monitor for any concerning new or worsening symptoms.  If any concerning symptoms arise please return for further evaluation.  Symptoms would include fainting, chest pain, dizziness, visual disturbances, or shortness of breath that does not resolve.  Continue to hydrate as much as possible and follow-up with your primary care for reassessment.

## 2024-11-01 NOTE — ED Triage Notes (Signed)
 Pt comes in for palpitations for the past week and worsen over time. Pt also had SOB and lower/mid back pain.

## 2024-11-05 ENCOUNTER — Ambulatory Visit

## 2024-11-05 VITALS — BP 112/70 | HR 88 | Ht 66.0 in | Wt 273.8 lb

## 2024-11-05 DIAGNOSIS — R002 Palpitations: Secondary | ICD-10-CM

## 2024-11-05 NOTE — Progress Notes (Unsigned)
 Enrolled patient for a 7 day [employer] XT monitor to be mailed to patients home.

## 2024-12-06 ENCOUNTER — Other Ambulatory Visit (HOSPITAL_COMMUNITY)

## 2024-12-09 ENCOUNTER — Ambulatory Visit

## 2025-01-13 ENCOUNTER — Ambulatory Visit
